# Patient Record
Sex: Male | Born: 1975 | Race: White | Hispanic: No | Marital: Married | Smoking: Never smoker
Health system: Southern US, Community
[De-identification: ages and names within clinical notes are randomized; demographics above are authoritative.]

## PROBLEM LIST (undated history)

## (undated) DIAGNOSIS — Z9889 Other specified postprocedural states: Secondary | ICD-10-CM

## (undated) DIAGNOSIS — R112 Nausea with vomiting, unspecified: Secondary | ICD-10-CM

## (undated) DIAGNOSIS — D171 Benign lipomatous neoplasm of skin and subcutaneous tissue of trunk: Secondary | ICD-10-CM

## (undated) DIAGNOSIS — J302 Other seasonal allergic rhinitis: Secondary | ICD-10-CM

## (undated) DIAGNOSIS — K219 Gastro-esophageal reflux disease without esophagitis: Secondary | ICD-10-CM

## (undated) HISTORY — PX: LAPAROSCOPIC APPENDECTOMY: SUR753

## (undated) HISTORY — PX: WISDOM TOOTH EXTRACTION: SHX21

---

## 1999-11-23 ENCOUNTER — Encounter: Admission: RE | Admit: 1999-11-23 | Discharge: 1999-12-19 | Payer: Self-pay | Admitting: Family Medicine

## 2005-09-24 HISTORY — PX: LUMBAR FUSION: SHX111

## 2006-07-12 ENCOUNTER — Inpatient Hospital Stay (HOSPITAL_COMMUNITY): Admission: RE | Admit: 2006-07-12 | Discharge: 2006-07-16 | Payer: Self-pay | Admitting: Neurosurgery

## 2015-03-25 DIAGNOSIS — D171 Benign lipomatous neoplasm of skin and subcutaneous tissue of trunk: Secondary | ICD-10-CM

## 2015-03-25 HISTORY — DX: Benign lipomatous neoplasm of skin and subcutaneous tissue of trunk: D17.1

## 2015-04-05 NOTE — H&P (Addendum)
  Subjective:    Patient ID: Dan Baker is a 39 y.o. male.  HPI  Referred by Dr. Elvera Lennox for treatment mass left flank. Notes he had lumbar fusion 2007 and shortly thereafter developed mass over left flank. Has steadily grown in size. Evaluated by dermatologist 5 years ago and was told cosmetic. Since that time has continued to grow and now painful with movement of back, tender with continuous pressure over area. Also had two acute episodes of muscle spasm in area requiring muscle relaxant.  Review of Systems 12 point review negative  PMH: none PSH: lumbar fusion SH: non smoker    Objective:   Physical Exam  Cardiovascular: Normal rate, regular rhythm and normal heart sounds.  Pulmonary/Chest: Effort normal and breath sounds normal.  Musculoskeletal:  Left back/flank with 10 x 15 cm nonmobile firm mass, no punctum, no cellulitis  Skin:  Dan Baker 2       Assessment:     Lipoma     Plan:     Large lipoma back that is continuing to grow and now associated pain, tenderness, muscle spasms. Rec excision. Given size and location, plan in OR under GA. Reviewed OP procedure, possible drain, post procedure limitations, scar. Reviewed importance sun protection scar, risk recurrence, seroma.     Dan Limbo, MD [facility] [phone]

## 2015-04-08 ENCOUNTER — Encounter (HOSPITAL_BASED_OUTPATIENT_CLINIC_OR_DEPARTMENT_OTHER): Payer: Self-pay | Admitting: *Deleted

## 2015-04-15 ENCOUNTER — Ambulatory Visit (HOSPITAL_BASED_OUTPATIENT_CLINIC_OR_DEPARTMENT_OTHER): Payer: 59 | Admitting: Anesthesiology

## 2015-04-15 ENCOUNTER — Encounter (HOSPITAL_BASED_OUTPATIENT_CLINIC_OR_DEPARTMENT_OTHER)
Admission: RE | Disposition: A | Discharge status: Home / Self Care | Payer: Self-pay | Source: Ambulatory Visit | Attending: Plastic Surgery

## 2015-04-15 ENCOUNTER — Encounter (HOSPITAL_BASED_OUTPATIENT_CLINIC_OR_DEPARTMENT_OTHER): Payer: Self-pay | Admitting: Anesthesiology

## 2015-04-15 ENCOUNTER — Ambulatory Visit (HOSPITAL_BASED_OUTPATIENT_CLINIC_OR_DEPARTMENT_OTHER)
Admission: RE | Admit: 2015-04-15 | Discharge: 2015-04-15 | Disposition: A | Discharge status: Home / Self Care | Payer: 59 | Source: Ambulatory Visit | Attending: Plastic Surgery | Admitting: Plastic Surgery

## 2015-04-15 DIAGNOSIS — D171 Benign lipomatous neoplasm of skin and subcutaneous tissue of trunk: Secondary | ICD-10-CM | POA: Diagnosis present

## 2015-04-15 DIAGNOSIS — K219 Gastro-esophageal reflux disease without esophagitis: Secondary | ICD-10-CM | POA: Insufficient documentation

## 2015-04-15 DIAGNOSIS — Z981 Arthrodesis status: Secondary | ICD-10-CM | POA: Diagnosis not present

## 2015-04-15 HISTORY — DX: Gastro-esophageal reflux disease without esophagitis: K21.9

## 2015-04-15 HISTORY — DX: Other specified postprocedural states: Z98.890

## 2015-04-15 HISTORY — DX: Other seasonal allergic rhinitis: J30.2

## 2015-04-15 HISTORY — PX: MASS EXCISION: SHX2000

## 2015-04-15 HISTORY — DX: Benign lipomatous neoplasm of skin and subcutaneous tissue of trunk: D17.1

## 2015-04-15 HISTORY — DX: Nausea with vomiting, unspecified: R11.2

## 2015-04-15 LAB — POCT HEMOGLOBIN-HEMACUE: Hemoglobin: 16.2 g/dL (ref 13.0–17.0)

## 2015-04-15 SURGERY — EXCISION MASS
Anesthesia: General | Laterality: Left

## 2015-04-15 MED ORDER — OXYCODONE-ACETAMINOPHEN 5-325 MG PO TABS: 1.0000 | ORAL_TABLET | ORAL | Status: DC | PRN

## 2015-04-15 MED ORDER — LACTATED RINGERS IV SOLN
INTRAVENOUS | Status: DC
  Administered 2015-04-15: 10:00:00 via INTRAVENOUS
  Administered 2015-04-15: 10 mL/h via INTRAVENOUS

## 2015-04-15 MED ORDER — DEXAMETHASONE SODIUM PHOSPHATE 4 MG/ML IJ SOLN
INTRAMUSCULAR | Status: DC | PRN
  Administered 2015-04-15: 10 mg via INTRAVENOUS

## 2015-04-15 MED ORDER — MIDAZOLAM HCL 2 MG/2ML IJ SOLN
INTRAMUSCULAR | Status: AC
  Filled 2015-04-15: qty 2

## 2015-04-15 MED ORDER — GLYCOPYRROLATE 0.2 MG/ML IJ SOLN: 0.2000 mg | Freq: Once | INTRAMUSCULAR | Status: DC | PRN

## 2015-04-15 MED ORDER — REGADENOSON 0.4 MG/5ML IV SOLN
INTRAVENOUS | Status: AC
  Filled 2015-04-15: qty 5

## 2015-04-15 MED ORDER — MIDAZOLAM HCL 2 MG/2ML IJ SOLN
1.0000 mg | INTRAMUSCULAR | Status: DC | PRN
  Administered 2015-04-15: 2 mg via INTRAVENOUS

## 2015-04-15 MED ORDER — SCOPOLAMINE 1 MG/3DAYS TD PT72: 1.0000 | MEDICATED_PATCH | Freq: Once | TRANSDERMAL | Status: DC | PRN

## 2015-04-15 MED ORDER — ONDANSETRON HCL 4 MG/2ML IJ SOLN
INTRAMUSCULAR | Status: DC | PRN
  Administered 2015-04-15: 4 mg via INTRAVENOUS

## 2015-04-15 MED ORDER — LIDOCAINE HCL (CARDIAC) 10 MG/ML IV SOLN
INTRAVENOUS | Status: DC | PRN
  Administered 2015-04-15: 60 mg via INTRAVENOUS

## 2015-04-15 MED ORDER — FENTANYL CITRATE (PF) 100 MCG/2ML IJ SOLN
50.0000 ug | INTRAMUSCULAR | Status: DC | PRN
  Administered 2015-04-15: 100 ug via INTRAVENOUS
  Administered 2015-04-15: 50 ug via INTRAVENOUS

## 2015-04-15 MED ORDER — OXYCODONE HCL 5 MG PO TABS: 5.0000 mg | ORAL_TABLET | Freq: Once | ORAL | Status: DC | PRN

## 2015-04-15 MED ORDER — BACITRACIN ZINC 500 UNIT/GM EX OINT
TOPICAL_OINTMENT | CUTANEOUS | Status: AC
  Filled 2015-04-15: qty 0.9

## 2015-04-15 MED ORDER — FENTANYL CITRATE (PF) 100 MCG/2ML IJ SOLN
25.0000 ug | INTRAMUSCULAR | Status: DC | PRN
  Administered 2015-04-15: 50 ug via INTRAVENOUS

## 2015-04-15 MED ORDER — PROPOFOL 10 MG/ML IV BOLUS
INTRAVENOUS | Status: DC | PRN
  Administered 2015-04-15: 200 mg via INTRAVENOUS
  Administered 2015-04-15: 50 mg via INTRAVENOUS

## 2015-04-15 MED ORDER — ONDANSETRON HCL 4 MG/2ML IJ SOLN: 4.0000 mg | Freq: Four times a day (QID) | INTRAMUSCULAR | Status: DC | PRN

## 2015-04-15 MED ORDER — BUPIVACAINE-EPINEPHRINE 0.25% -1:200000 IJ SOLN
INTRAMUSCULAR | Status: DC | PRN
  Administered 2015-04-15: 20 mL

## 2015-04-15 MED ORDER — FENTANYL CITRATE (PF) 100 MCG/2ML IJ SOLN
INTRAMUSCULAR | Status: AC
  Filled 2015-04-15: qty 2

## 2015-04-15 MED ORDER — FENTANYL CITRATE (PF) 100 MCG/2ML IJ SOLN
INTRAMUSCULAR | Status: AC
  Filled 2015-04-15: qty 6

## 2015-04-15 MED ORDER — OXYCODONE HCL 5 MG/5ML PO SOLN: 5.0000 mg | Freq: Once | ORAL | Status: DC | PRN

## 2015-04-15 MED ORDER — CHLORHEXIDINE GLUCONATE 4 % EX LIQD: 1.0000 "application " | Freq: Once | CUTANEOUS | Status: DC

## 2015-04-15 MED ORDER — CEFAZOLIN SODIUM-DEXTROSE 2-3 GM-% IV SOLR
2.0000 g | INTRAVENOUS | Status: AC
  Administered 2015-04-15: 2 g via INTRAVENOUS

## 2015-04-15 MED ORDER — SUCCINYLCHOLINE CHLORIDE 20 MG/ML IJ SOLN
INTRAMUSCULAR | Status: DC | PRN
  Administered 2015-04-15: 100 mg via INTRAVENOUS

## 2015-04-15 MED ORDER — LIDOCAINE-EPINEPHRINE 1 %-1:100000 IJ SOLN
INTRAMUSCULAR | Status: AC
  Filled 2015-04-15: qty 1

## 2015-04-15 SURGICAL SUPPLY — 63 items
APL SKNCLS STERI-STRIP NONHPOA (GAUZE/BANDAGES/DRESSINGS)
BENZOIN TINCTURE PRP APPL 2/3 (GAUZE/BANDAGES/DRESSINGS) IMPLANT
BLADE CLIPPER SURG (BLADE) IMPLANT
BLADE SURG 11 STRL SS (BLADE) IMPLANT
BLADE SURG 15 STRL LF DISP TIS (BLADE) ×1 IMPLANT
BLADE SURG 15 STRL SS (BLADE) ×3
CANISTER SUCT 1200ML W/VALVE (MISCELLANEOUS) ×2 IMPLANT
CHLORAPREP W/TINT 26ML (MISCELLANEOUS) ×3 IMPLANT
CLOSURE WOUND 1/2 X4 (GAUZE/BANDAGES/DRESSINGS)
COVER BACK TABLE 60X90IN (DRAPES) ×3 IMPLANT
COVER MAYO STAND STRL (DRAPES) ×3 IMPLANT
DRAIN JP 10F RND SILICONE (MISCELLANEOUS) ×3 IMPLANT
DRAPE LAPAROTOMY 100X72 PEDS (DRAPES) ×2 IMPLANT
DRAPE U-SHAPE 76X120 STRL (DRAPES) IMPLANT
DRSG PAD ABDOMINAL 8X10 ST (GAUZE/BANDAGES/DRESSINGS) ×2 IMPLANT
DRSG TELFA 3X8 NADH (GAUZE/BANDAGES/DRESSINGS) IMPLANT
ELECT COATED BLADE 2.86 ST (ELECTRODE) ×2 IMPLANT
ELECT NEEDLE BLADE 2-5/6 (NEEDLE) IMPLANT
ELECT REM PT RETURN 9FT ADLT (ELECTROSURGICAL) ×3
ELECT REM PT RETURN 9FT PED (ELECTROSURGICAL)
ELECTRODE REM PT RETRN 9FT PED (ELECTROSURGICAL) IMPLANT
ELECTRODE REM PT RTRN 9FT ADLT (ELECTROSURGICAL) ×1 IMPLANT
EVACUATOR SILICONE 100CC (DRAIN) ×2 IMPLANT
GAUZE XEROFORM 1X8 LF (GAUZE/BANDAGES/DRESSINGS) IMPLANT
GLOVE BIO SURGEON STRL SZ 6 (GLOVE) ×3 IMPLANT
GLOVE SURG SS PI 7.0 STRL IVOR (GLOVE) ×4 IMPLANT
GOWN STRL REUS W/ TWL LRG LVL3 (GOWN DISPOSABLE) ×2 IMPLANT
GOWN STRL REUS W/TWL LRG LVL3 (GOWN DISPOSABLE) ×6
LIQUID BAND (GAUZE/BANDAGES/DRESSINGS) ×3 IMPLANT
NDL PRECISIONGLIDE 27X1.5 (NEEDLE) ×1 IMPLANT
NEEDLE HYPO 30GX1 BEV (NEEDLE) IMPLANT
NEEDLE PRECISIONGLIDE 27X1.5 (NEEDLE) ×3 IMPLANT
NS IRRIG 1000ML POUR BTL (IV SOLUTION) ×2 IMPLANT
PACK BASIN DAY SURGERY FS (CUSTOM PROCEDURE TRAY) ×3 IMPLANT
PENCIL BUTTON HOLSTER BLD 10FT (ELECTRODE) ×3 IMPLANT
PIN SAFETY STERILE (MISCELLANEOUS) ×2 IMPLANT
RUBBERBAND STERILE (MISCELLANEOUS) IMPLANT
SHEET MEDIUM DRAPE 40X70 STRL (DRAPES) IMPLANT
SLEEVE SCD COMPRESS KNEE MED (MISCELLANEOUS) ×2 IMPLANT
SPONGE GAUZE 2X2 8PLY STER LF (GAUZE/BANDAGES/DRESSINGS)
SPONGE GAUZE 2X2 8PLY STRL LF (GAUZE/BANDAGES/DRESSINGS) IMPLANT
SPONGE GAUZE 4X4 12PLY STER LF (GAUZE/BANDAGES/DRESSINGS) IMPLANT
SPONGE LAP 18X18 X RAY DECT (DISPOSABLE) ×2 IMPLANT
STRIP CLOSURE SKIN 1/2X4 (GAUZE/BANDAGES/DRESSINGS) IMPLANT
SUCTION FRAZIER TIP 10 FR DISP (SUCTIONS) IMPLANT
SUT ETHILON 4 0 PS 2 18 (SUTURE) ×2 IMPLANT
SUT MNCRL AB 4-0 PS2 18 (SUTURE) ×3 IMPLANT
SUT MON AB 5-0 P3 18 (SUTURE) IMPLANT
SUT PLAIN 5 0 P 3 18 (SUTURE) IMPLANT
SUT PROLENE 5 0 P 3 (SUTURE) IMPLANT
SUT PROLENE 6 0 P 1 18 (SUTURE) IMPLANT
SUT VIC AB 3-0 PS1 18 (SUTURE) ×3
SUT VIC AB 3-0 PS1 18XBRD (SUTURE) ×1 IMPLANT
SUT VICRYL 4-0 PS2 18IN ABS (SUTURE) ×2 IMPLANT
SWAB COLLECTION DEVICE MRSA (MISCELLANEOUS) IMPLANT
SYR BULB 3OZ (MISCELLANEOUS) ×2 IMPLANT
SYR CONTROL 10ML LL (SYRINGE) ×3 IMPLANT
TOWEL OR 17X24 6PK STRL BLUE (TOWEL DISPOSABLE) ×3 IMPLANT
TRAY DSU PREP LF (CUSTOM PROCEDURE TRAY) IMPLANT
TUBE ANAEROBIC SPECIMEN COL (MISCELLANEOUS) IMPLANT
TUBE CONNECTING 20'X1/4 (TUBING) ×1
TUBE CONNECTING 20X1/4 (TUBING) ×1 IMPLANT
YANKAUER SUCT BULB TIP 10FT TU (MISCELLANEOUS) ×2 IMPLANT

## 2015-04-15 NOTE — Anesthesia Procedure Notes (Signed)
Procedure Name: Intubation Date/Time: 04/15/2015 9:27 AM Performed by: Lyndee Leo Pre-anesthesia Checklist: Patient identified, Emergency Drugs available, Suction available and Patient being monitored Patient Re-evaluated:Patient Re-evaluated prior to inductionOxygen Delivery Method: Circle System Utilized Preoxygenation: Pre-oxygenation with 100% oxygen Intubation Type: IV induction Ventilation: Mask ventilation without difficulty Laryngoscope Size: Miller and 3 Grade View: Grade II Tube type: Oral Tube size: 8.0 mm Number of attempts: 1 Airway Equipment and Method: Stylet and Oral airway Placement Confirmation: ETT inserted through vocal cords under direct vision,  positive ETCO2 and breath sounds checked- equal and bilateral Secured at: 22 cm Tube secured with: Tape Dental Injury: Teeth and Oropharynx as per pre-operative assessment

## 2015-04-15 NOTE — Op Note (Signed)
Operative Note   DATE OF OPERATION: 7.22.16  LOCATION: [facility]-outpatient  SURGICAL DIVISION: Plastic Surgery  PREOPERATIVE DIAGNOSES:  1. Soft tissue mass back 2. Lipoma  POSTOPERATIVE DIAGNOSES:  same  PROCEDURE:  1. Excision subfascial mass left back 10 cm  SURGEON: [name] MD MBA  ASSISTANT: none  ANESTHESIA:  General.   EBL: minimal  COMPLICATIONS: None immediate.   INDICATIONS FOR PROCEDURE:  The patient, Dan Baker, is a 39 y.o. male born on 04/17/1976, is here for excision mass left flank.   FINDINGS: Clinically lipoma located beneath Scarpa's fascia and adherent to latissimus muscle.   DESCRIPTION OF PROCEDURE:  The patient's operative site was marked with the patient in the preoperative area. The patient was taken to the operating room. SCDs were placed and IV antibiotics were given. Following induction, patient was placed in prone position.The patient's operative site was prepped and draped in a sterile fashion. A time out was performed and all information was confirmed to be correct. Local anesthetic infiltrated surrounding mass. Incision made in natural skin tension line over mass. Incision carried through subcutaneous tissue and Scarpa's fascia incised. Yellow non encapsulated mass consistent with lipoma encountered. Skin and subcutaneous tissue elevated off anterior surface mass and then mass elevated off latissimus dorsi muscle. Cavity irrigated and hemostasis obtained. Mass dimensions 10 x 15 cm. Additional local anesthetic infiltrated into underlying muscle. 10 Fr round drain placed and secured to skin with 3-0 nylon. Plication suture placed between Scarpa's fascia and latissimus muscle. Closure incision completed with 3-0 vicryl in fascial layered followed by 4-0 vicryl in dermis. Skin closed with 4-0 monocryl subcuticular and tissue adhesive applied. Dry dressing applied and patient returned to supine position.   The patient was allowed to wake  from anesthesia, extubated and taken to the recovery room in satisfactory condition.   SPECIMENS: soft tissue mass left back  DRAINS: 10 Fr JP in left back  [name], MD [facility] [phone]

## 2015-04-15 NOTE — Discharge Instructions (Signed)

## 2015-04-15 NOTE — Interval H&P Note (Signed)
History and Physical Interval Note:  04/15/2015 9:16 AM  Dan Baker  has presented today for surgery, with the diagnosis of LIPOMA FLANK  The various methods of treatment have been discussed with the patient and family. After consideration of risks, benefits and other options for treatment, the patient has consented to  Procedure(s): EXCISION OF SUBCUTANEOUS MASS,LEFT BACK 10CM (N/A) as a surgical intervention .  The patient's history has been reviewed, patient examined, no change in status, stable for surgery.  I have reviewed the patient's chart and labs.  Questions were answered to the patient's satisfaction.     Dan Baker

## 2015-04-15 NOTE — Anesthesia Postprocedure Evaluation (Signed)
Anesthesia Post Note  Patient: Dan Baker  Procedure(s) Performed: Procedure(s) (LRB): EXCISION OF SUBCUTANEOUS MASS,LEFT BACK 10CM (Left)  Anesthesia type: General  Patient location: PACU  Post pain: Pain level controlled and Adequate analgesia  Post assessment: Post-op Vital signs reviewed, Patient's Cardiovascular Status Stable, Respiratory Function Stable, Patent Airway and Pain level controlled  Last Vitals:  Filed Vitals:   04/15/15 1129  BP: 108/64  Pulse: 53  Temp: 36.5 C  Resp: 16    Post vital signs: Reviewed and stable  Level of consciousness: awake, alert  and oriented  Complications: No apparent anesthesia complications

## 2015-04-15 NOTE — Transfer of Care (Signed)
Immediate Anesthesia Transfer of Care Note  Patient: Dan Baker  Procedure(s) Performed: Procedure(s): EXCISION OF SUBCUTANEOUS MASS,LEFT BACK 10CM (Left)  Patient Location: PACU  Anesthesia Type:General  Level of Consciousness: awake, sedated and patient cooperative  Airway & Oxygen Therapy: Patient Spontanous Breathing and Patient connected to face mask oxygen  Post-op Assessment: Report given to RN and Post -op Vital signs reviewed and stable  Post vital signs: Reviewed and stable  Last Vitals:  Filed Vitals:   04/15/15 0839  BP: 117/74  Pulse: 64  Temp: 36.6 C  Resp: 20    Complications: No apparent anesthesia complications

## 2015-04-15 NOTE — Anesthesia Preprocedure Evaluation (Signed)
Anesthesia Evaluation  Patient identified by MRN, date of birth, ID band Patient awake    Reviewed: Allergy & Precautions, NPO status , Patient's Chart, lab work & pertinent test results  History of Anesthesia Complications (+) PONV and history of anesthetic complications  Airway Mallampati: II   Neck ROM: full    Dental   Pulmonary neg pulmonary ROS,  breath sounds clear to auscultation        Cardiovascular negative cardio ROS  Rhythm:regular Rate:Normal     Neuro/Psych    GI/Hepatic GERD-  ,  Endo/Other    Renal/GU      Musculoskeletal   Abdominal   Peds  Hematology   Anesthesia Other Findings   Reproductive/Obstetrics                             Anesthesia Physical Anesthesia Plan  ASA: II  Anesthesia Plan: General   Post-op Pain Management:    Induction: Intravenous  Airway Management Planned: Oral ETT  Additional Equipment:   Intra-op Plan:   Post-operative Plan: Extubation in OR  Informed Consent: I have reviewed the patients History and Physical, chart, labs and discussed the procedure including the risks, benefits and alternatives for the proposed anesthesia with the patient or authorized representative who has indicated his/her understanding and acceptance.     Plan Discussed with: CRNA, Anesthesiologist and Surgeon  Anesthesia Plan Comments:         Anesthesia Quick Evaluation

## 2015-04-18 ENCOUNTER — Encounter (HOSPITAL_BASED_OUTPATIENT_CLINIC_OR_DEPARTMENT_OTHER): Payer: Self-pay | Admitting: Plastic Surgery

## 2019-11-29 ENCOUNTER — Ambulatory Visit: Payer: 59 | Attending: Internal Medicine

## 2019-11-29 DIAGNOSIS — Z23 Encounter for immunization: Secondary | ICD-10-CM | POA: Insufficient documentation

## 2019-11-29 NOTE — Progress Notes (Signed)
   Covid-19 Vaccination Clinic  Name:  Dan Baker    MRN: [id number] DOB: [birth year]  11/29/2019  [name] was observed post Covid-19 immunization for 15 minutes without incident. He was provided with Vaccine Information Sheet and instruction to access the V-Safe system.   [name] was instructed to call 911 with any severe reactions post vaccine: Marland Kitchen Difficulty breathing  . Swelling of face and throat  . A fast heartbeat  . A bad rash all over body  . Dizziness and weakness   Immunizations Administered    Name Date Dose VIS Date Route   Pfizer COVID-19 Vaccine 11/29/2019  6:21 PM 0.3 mL 09/04/2019 Intramuscular   Manufacturer: [place]   Lot: [id number]   [place]: [id number]

## 2019-12-30 ENCOUNTER — Ambulatory Visit: Payer: 59 | Attending: Internal Medicine

## 2019-12-30 DIAGNOSIS — Z23 Encounter for immunization: Secondary | ICD-10-CM

## 2019-12-30 NOTE — Progress Notes (Signed)
   Covid-19 Vaccination Clinic  Name:  Dan Baker    MRN: [id number] DOB: [birth year]  12/30/2019  Dan Baker was observed post Covid-19 immunization for 15 minutes without incident. He was provided with Vaccine Information Sheet and instruction to access the V-Safe system.   Dan Baker was instructed to call 911 with any severe reactions post vaccine: Marland Kitchen Difficulty breathing  . Swelling of face and throat  . A fast heartbeat  . A bad rash all over body  . Dizziness and weakness   Immunizations Administered    Name Date Dose VIS Date Route   Pfizer COVID-19 Vaccine 12/30/2019 12:49 PM 0.3 mL 09/04/2019 Intramuscular   Manufacturer: [place]   Lot: [id number]   [place]: [id number]

## 2020-08-06 ENCOUNTER — Ambulatory Visit: Payer: Self-pay

## 2020-08-06 ENCOUNTER — Ambulatory Visit: Payer: Self-pay | Attending: Internal Medicine

## 2020-08-06 DIAGNOSIS — Z23 Encounter for immunization: Secondary | ICD-10-CM

## 2020-08-06 NOTE — Progress Notes (Signed)
   Covid-19 Vaccination Clinic  Name:  Dan Baker    MRN: [id number] DOB: [birth year]  08/06/2020  [name] was observed post Covid-19 immunization for 15 minutes without incident. He was provided with Vaccine Information Sheet and instruction to access the V-Safe system.   [name] was instructed to call 911 with any severe reactions post vaccine: Marland Kitchen Difficulty breathing  . Swelling of face and throat  . A fast heartbeat  . A bad rash all over body  . Dizziness and weakness   Immunizations Administered    Name Date Dose VIS Date Route   Pfizer COVID-19 Vaccine 08/06/2020  4:49 PM 0.3 mL 07/13/2020 Intramuscular   Manufacturer: [place]   Lot: [id number]   [place]: [id number]

## 2021-02-08 ENCOUNTER — Emergency Department (HOSPITAL_BASED_OUTPATIENT_CLINIC_OR_DEPARTMENT_OTHER): Payer: BC Managed Care – PPO

## 2021-02-08 ENCOUNTER — Other Ambulatory Visit: Payer: Self-pay

## 2021-02-08 ENCOUNTER — Encounter (HOSPITAL_COMMUNITY)
Admission: EM | Disposition: A | Discharge status: Home / Self Care | Payer: Self-pay | Source: Home / Self Care | Attending: Emergency Medicine

## 2021-02-08 ENCOUNTER — Encounter (HOSPITAL_BASED_OUTPATIENT_CLINIC_OR_DEPARTMENT_OTHER): Payer: Self-pay

## 2021-02-08 ENCOUNTER — Ambulatory Visit (HOSPITAL_BASED_OUTPATIENT_CLINIC_OR_DEPARTMENT_OTHER)
Admission: EM | Admit: 2021-02-08 | Discharge: 2021-02-09 | Disposition: A | Discharge status: Home / Self Care | Payer: BC Managed Care – PPO | Attending: Emergency Medicine | Admitting: Emergency Medicine

## 2021-02-08 ENCOUNTER — Emergency Department (HOSPITAL_COMMUNITY): Payer: BC Managed Care – PPO | Admitting: Anesthesiology

## 2021-02-08 DIAGNOSIS — Z20822 Contact with and (suspected) exposure to covid-19: Secondary | ICD-10-CM | POA: Insufficient documentation

## 2021-02-08 DIAGNOSIS — Z79899 Other long term (current) drug therapy: Secondary | ICD-10-CM | POA: Diagnosis not present

## 2021-02-08 DIAGNOSIS — K3580 Unspecified acute appendicitis: Secondary | ICD-10-CM | POA: Insufficient documentation

## 2021-02-08 DIAGNOSIS — R1031 Right lower quadrant pain: Secondary | ICD-10-CM | POA: Diagnosis present

## 2021-02-08 DIAGNOSIS — Z880 Allergy status to penicillin: Secondary | ICD-10-CM | POA: Diagnosis not present

## 2021-02-08 DIAGNOSIS — K353 Acute appendicitis with localized peritonitis, without perforation or gangrene: Secondary | ICD-10-CM

## 2021-02-08 HISTORY — PX: LAPAROSCOPIC APPENDECTOMY: SHX408

## 2021-02-08 LAB — URINALYSIS, ROUTINE W REFLEX MICROSCOPIC
Bilirubin Urine: NEGATIVE
Glucose, UA: NEGATIVE mg/dL
Hgb urine dipstick: NEGATIVE
Ketones, ur: NEGATIVE mg/dL
Leukocytes,Ua: NEGATIVE
Nitrite: NEGATIVE
Protein, ur: NEGATIVE mg/dL
Specific Gravity, Urine: 1.016 (ref 1.005–1.030)
pH: 6.5 (ref 5.0–8.0)

## 2021-02-08 LAB — CBC WITH DIFFERENTIAL/PLATELET
Abs Immature Granulocytes: 0.06 10*3/uL (ref 0.00–0.07)
Basophils Absolute: 0 10*3/uL (ref 0.0–0.1)
Basophils Relative: 0 %
Eosinophils Absolute: 0.4 10*3/uL (ref 0.0–0.5)
Eosinophils Relative: 3 %
HCT: 45.3 % (ref 39.0–52.0)
Hemoglobin: 15.6 g/dL (ref 13.0–17.0)
Immature Granulocytes: 0 %
Lymphocytes Relative: 19 %
Lymphs Abs: 2.5 10*3/uL (ref 0.7–4.0)
MCH: 30.3 pg (ref 26.0–34.0)
MCHC: 34.4 g/dL (ref 30.0–36.0)
MCV: 88 fL (ref 80.0–100.0)
Monocytes Absolute: 1.1 10*3/uL — ABNORMAL HIGH (ref 0.1–1.0)
Monocytes Relative: 8 %
Neutro Abs: 9.5 10*3/uL — ABNORMAL HIGH (ref 1.7–7.7)
Neutrophils Relative %: 70 %
Platelets: 225 10*3/uL (ref 150–400)
RBC: 5.15 MIL/uL (ref 4.22–5.81)
RDW: 12.2 % (ref 11.5–15.5)
WBC: 13.6 10*3/uL — ABNORMAL HIGH (ref 4.0–10.5)
nRBC: 0 % (ref 0.0–0.2)

## 2021-02-08 LAB — COMPREHENSIVE METABOLIC PANEL
ALT: 14 U/L (ref 0–44)
AST: 13 U/L — ABNORMAL LOW (ref 15–41)
Albumin: 4.8 g/dL (ref 3.5–5.0)
Alkaline Phosphatase: 68 U/L (ref 38–126)
Anion gap: 10 (ref 5–15)
BUN: 12 mg/dL (ref 6–20)
CO2: 29 mmol/L (ref 22–32)
Calcium: 9.9 mg/dL (ref 8.9–10.3)
Chloride: 100 mmol/L (ref 98–111)
Creatinine, Ser: 1.09 mg/dL (ref 0.61–1.24)
GFR, Estimated: >60 mL/min (ref >60)
Glucose, Bld: 95 mg/dL (ref 70–99)
Potassium: 3.7 mmol/L (ref 3.5–5.1)
Sodium: 139 mmol/L (ref 135–145)
Total Bilirubin: 0.6 mg/dL (ref 0.3–1.2)
Total Protein: 7.5 g/dL (ref 6.5–8.1)

## 2021-02-08 LAB — RESP PANEL BY RT-PCR (FLU A&B, COVID) ARPGX2
Influenza A by PCR: NEGATIVE
Influenza B by PCR: NEGATIVE
SARS Coronavirus 2 by RT PCR: NEGATIVE

## 2021-02-08 LAB — LIPASE, BLOOD: Lipase: 19 U/L (ref 11–51)

## 2021-02-08 SURGERY — APPENDECTOMY, LAPAROSCOPIC
Anesthesia: General | Site: Abdomen

## 2021-02-08 MED ORDER — ONDANSETRON HCL 4 MG/2ML IJ SOLN
INTRAMUSCULAR | Status: DC | PRN
  Administered 2021-02-08: 4 mg via INTRAVENOUS

## 2021-02-08 MED ORDER — OXYCODONE HCL 5 MG PO TABS
5.0000 mg | ORAL_TABLET | Freq: Once | ORAL | Status: AC | PRN
  Administered 2021-02-09: 5 mg via ORAL

## 2021-02-08 MED ORDER — FENTANYL CITRATE (PF) 250 MCG/5ML IJ SOLN
INTRAMUSCULAR | Status: DC | PRN
  Administered 2021-02-08 (×3): 50 ug via INTRAVENOUS
  Administered 2021-02-08: 100 ug via INTRAVENOUS

## 2021-02-08 MED ORDER — MIDAZOLAM HCL 5 MG/5ML IJ SOLN
INTRAMUSCULAR | Status: DC | PRN
  Administered 2021-02-08: 2 mg via INTRAVENOUS

## 2021-02-08 MED ORDER — LIDOCAINE HCL (CARDIAC) PF 100 MG/5ML IV SOSY
PREFILLED_SYRINGE | INTRAVENOUS | Status: DC | PRN
  Administered 2021-02-08: 60 mg via INTRATRACHEAL

## 2021-02-08 MED ORDER — ROCURONIUM BROMIDE 100 MG/10ML IV SOLN
INTRAVENOUS | Status: DC | PRN
  Administered 2021-02-08: 50 mg via INTRAVENOUS

## 2021-02-08 MED ORDER — METRONIDAZOLE 500 MG/100ML IV SOLN
500.0000 mg | Freq: Once | INTRAVENOUS | Status: AC
  Administered 2021-02-08: 500 mg via INTRAVENOUS
  Filled 2021-02-08: qty 100

## 2021-02-08 MED ORDER — MORPHINE SULFATE (PF) 4 MG/ML IV SOLN
4.0000 mg | Freq: Once | INTRAVENOUS | Status: AC
  Administered 2021-02-08: 4 mg via INTRAVENOUS
  Filled 2021-02-08: qty 1

## 2021-02-08 MED ORDER — PROMETHAZINE HCL 25 MG/ML IJ SOLN: 6.2500 mg | INTRAMUSCULAR | Status: DC | PRN

## 2021-02-08 MED ORDER — SODIUM CHLORIDE 0.9 % IV BOLUS
1000.0000 mL | Freq: Once | INTRAVENOUS | Status: AC
  Administered 2021-02-08: 1000 mL via INTRAVENOUS

## 2021-02-08 MED ORDER — 0.9 % SODIUM CHLORIDE (POUR BTL) OPTIME
TOPICAL | Status: DC | PRN
  Administered 2021-02-08: 1000 mL

## 2021-02-08 MED ORDER — SODIUM CHLORIDE 0.9 % IR SOLN
Status: DC | PRN
  Administered 2021-02-08: 1000 mL

## 2021-02-08 MED ORDER — DEXAMETHASONE SODIUM PHOSPHATE 10 MG/ML IJ SOLN
INTRAMUSCULAR | Status: DC | PRN
  Administered 2021-02-08: 10 mg via INTRAVENOUS

## 2021-02-08 MED ORDER — ACETAMINOPHEN 10 MG/ML IV SOLN
INTRAVENOUS | Status: DC | PRN
  Administered 2021-02-08: 1000 mg via INTRAVENOUS

## 2021-02-08 MED ORDER — LACTATED RINGERS IV SOLN: INTRAVENOUS | Status: DC | PRN

## 2021-02-08 MED ORDER — SUCCINYLCHOLINE CHLORIDE 20 MG/ML IJ SOLN
INTRAMUSCULAR | Status: DC | PRN
  Administered 2021-02-08: 120 mg via INTRAVENOUS

## 2021-02-08 MED ORDER — SUGAMMADEX SODIUM 200 MG/2ML IV SOLN
INTRAVENOUS | Status: DC | PRN
  Administered 2021-02-08: 200 mg via INTRAVENOUS

## 2021-02-08 MED ORDER — OXYCODONE HCL 5 MG/5ML PO SOLN: 5.0000 mg | Freq: Once | ORAL | Status: AC | PRN

## 2021-02-08 MED ORDER — PHENYLEPHRINE 40 MCG/ML (10ML) SYRINGE FOR IV PUSH (FOR BLOOD PRESSURE SUPPORT)
PREFILLED_SYRINGE | INTRAVENOUS | Status: DC | PRN
  Administered 2021-02-08: 40 ug via INTRAVENOUS

## 2021-02-08 MED ORDER — SODIUM CHLORIDE 0.9 % IV SOLN
2.0000 g | Freq: Once | INTRAVENOUS | Status: AC
  Administered 2021-02-08: 2 g via INTRAVENOUS
  Filled 2021-02-08: qty 20

## 2021-02-08 MED ORDER — SODIUM CHLORIDE 0.9 % IV SOLN: INTRAVENOUS | Status: DC | PRN

## 2021-02-08 MED ORDER — BUPIVACAINE HCL 0.25 % IJ SOLN
INTRAMUSCULAR | Status: DC | PRN
  Administered 2021-02-08: 6 mL

## 2021-02-08 MED ORDER — TRAMADOL HCL 50 MG PO TABS: 50.0000 mg | ORAL_TABLET | Freq: Four times a day (QID) | ORAL | 0 refills | Status: AC | PRN

## 2021-02-08 MED ORDER — FENTANYL CITRATE (PF) 100 MCG/2ML IJ SOLN
25.0000 ug | INTRAMUSCULAR | Status: DC | PRN
  Administered 2021-02-09: 50 ug via INTRAVENOUS
  Administered 2021-02-09: 25 ug via INTRAVENOUS

## 2021-02-08 MED ORDER — PROPOFOL 10 MG/ML IV BOLUS
INTRAVENOUS | Status: DC | PRN
  Administered 2021-02-08: 200 mg via INTRAVENOUS

## 2021-02-08 SURGICAL SUPPLY — 42 items
ADH SKN CLS APL DERMABOND .7 (GAUZE/BANDAGES/DRESSINGS) ×1
APL PRP STRL LF DISP 70% ISPRP (MISCELLANEOUS) ×1
APPLIER CLIP 5 13 M/L LIGAMAX5 (MISCELLANEOUS)
APR CLP MED LRG 5 ANG JAW (MISCELLANEOUS)
BLADE CLIPPER SURG (BLADE) ×1 IMPLANT
CANISTER SUCT 3000ML PPV (MISCELLANEOUS) ×2 IMPLANT
CHLORAPREP W/TINT 26 (MISCELLANEOUS) ×2 IMPLANT
CLIP APPLIE 5 13 M/L LIGAMAX5 (MISCELLANEOUS) IMPLANT
CLIP VESOLOCK XL 6/CT (CLIP) ×2 IMPLANT
COVER SURGICAL LIGHT HANDLE (MISCELLANEOUS) ×2 IMPLANT
DERMABOND ADVANCED (GAUZE/BANDAGES/DRESSINGS) ×1
DERMABOND ADVANCED .7 DNX12 (GAUZE/BANDAGES/DRESSINGS) ×1 IMPLANT
ELECT REM PT RETURN 9FT ADLT (ELECTROSURGICAL) ×2
ELECTRODE REM PT RTRN 9FT ADLT (ELECTROSURGICAL) ×1 IMPLANT
ENDOLOOP SUT PDS II  0 18 (SUTURE)
ENDOLOOP SUT PDS II 0 18 (SUTURE) IMPLANT
GLOVE BIO SURGEON STRL SZ7.5 (GLOVE) ×2 IMPLANT
GOWN STRL REUS W/ TWL LRG LVL3 (GOWN DISPOSABLE) ×2 IMPLANT
GOWN STRL REUS W/ TWL XL LVL3 (GOWN DISPOSABLE) ×1 IMPLANT
GOWN STRL REUS W/TWL LRG LVL3 (GOWN DISPOSABLE) ×4
GOWN STRL REUS W/TWL XL LVL3 (GOWN DISPOSABLE) ×2
GRASPER SUT TROCAR 14GX15 (MISCELLANEOUS) ×2 IMPLANT
KIT BASIN OR (CUSTOM PROCEDURE TRAY) ×2 IMPLANT
KIT TURNOVER KIT B (KITS) ×2 IMPLANT
NDL INSUFFLATION 14GA 120MM (NEEDLE) ×1 IMPLANT
NEEDLE INSUFFLATION 14GA 120MM (NEEDLE) ×2 IMPLANT
NS IRRIG 1000ML POUR BTL (IV SOLUTION) ×2 IMPLANT
PAD ARMBOARD 7.5X6 YLW CONV (MISCELLANEOUS) ×5 IMPLANT
SCISSORS LAP 5X35 DISP (ENDOMECHANICALS) ×2 IMPLANT
SET IRRIG TUBING LAPAROSCOPIC (IRRIGATION / IRRIGATOR) ×2 IMPLANT
SET TUBE SMOKE EVAC HIGH FLOW (TUBING) ×2 IMPLANT
SLEEVE ENDOPATH XCEL 5M (ENDOMECHANICALS) ×2 IMPLANT
SLEEVE SCD COMPRESS KNEE MED (STOCKING) ×1 IMPLANT
SPECIMEN JAR SMALL (MISCELLANEOUS) ×2 IMPLANT
SUT MNCRL AB 4-0 PS2 18 (SUTURE) ×2 IMPLANT
TOWEL GREEN STERILE (TOWEL DISPOSABLE) ×2 IMPLANT
TOWEL GREEN STERILE FF (TOWEL DISPOSABLE) ×2 IMPLANT
TRAY LAPAROSCOPIC MC (CUSTOM PROCEDURE TRAY) ×2 IMPLANT
TROCAR XCEL NON-BLD 11X100MML (ENDOMECHANICALS) ×2 IMPLANT
TROCAR XCEL NON-BLD 5MMX100MML (ENDOMECHANICALS) ×2 IMPLANT
WARMER LAPAROSCOPE (MISCELLANEOUS) ×2 IMPLANT
WATER STERILE IRR 1000ML POUR (IV SOLUTION) ×2 IMPLANT

## 2021-02-08 NOTE — Discharge Instructions (Signed)
Laparoscopic Appendectomy, Adult, Care After This sheet gives you information about how to care for yourself after your procedure. Your doctor may also give you more specific instructions. If you have problems or questions, contact your doctor. What can I expect after the procedure? After the procedure, it is common to have:  Little energy for normal activities.  Mild pain in the area where the cuts from surgery (incisions) were made.  Trouble pooping (constipation). This can be caused by: ? Pain medicine. ? A lack of activity. Follow these instructions at home: Medicines  Take over-the-counter and prescription medicines only as told by your doctor.  If you were prescribed an antibiotic medicine, take it as told by your doctor. Do not stop taking it even if you start to feel better.  Do not drive or use heavy machinery while taking prescription pain medicine.  Ask your doctor if the medicine you are taking can cause trouble pooping. You may need to take steps to prevent or treat trouble pooping: ? Drink enough fluid to keep your pee (urine) pale yellow. ? Take over-the-counter or prescription medicines. ? Eat foods that are high in fiber. These include beans, whole grains, and fresh fruits and vegetables. ? Limit foods that are high in fat and sugar. These include fried or sweet foods. Incision care  Follow instructions from your doctor about how to take care of your cuts from surgery. Make sure you: ? Wash your hands with soap and water before and after you change your bandage (dressing). If you cannot use soap and water, use hand sanitizer. ? Change your bandage as told by your doctor. ? Leave stitches (sutures), skin glue, or skin tape (adhesive) strips in place. They may need to stay in place for 2 weeks or longer. If tape strips get loose and curl up, you may trim the loose edges. Do not remove tape strips completely unless your doctor says it is okay.  Check your cuts from  surgery every day for signs of infection. Check for: ? Redness, swelling, or pain. ? Fluid or blood. ? Warmth. ? Pus or a bad smell.   Bathing  Keep your cuts from surgery clean and dry. Clean them as told by your doctor. To do this: 1. Gently wash the cuts with soap and water. 2. Rinse the cuts with water to remove all soap. 3. Pat the cuts dry with a clean towel. Do not rub the cuts.  Do not take baths, swim, or use a hot tub for 2 weeks, or until your doctor says it is okay. You may take showers after 48 hours. Activity  Do not drive for 24 hours if you were given a medicine to help you relax (sedative) during your procedure.  Rest after the procedure. Return to your normal activities as told by your doctor. Ask your doctor what activities are safe for you.  For 3 weeks, or for as long as told by your doctor: ? Do not lift anything that is heavier than 10 lb (4.5 kg), or the limit that you are told. ? Do not play contact sports.   General instructions  If you were sent home with a drain, follow instructions from your doctor on how to care for it.  Take deep breaths. This helps to keep your lungs from getting an infection (pneumonia).  Keep all follow-up visits as told by your doctor. This is important. Contact a doctor if:  You have redness, swelling, or pain around a cut from  surgery.  You have fluid or blood coming from a cut.  Your cut feels warm to the touch.  You have pus or a bad smell coming from a cut or a bandage.  The edges of a cut break open after the stitches have been taken out.  You have pain in your shoulders that gets worse.  You feel dizzy or you pass out (faint).  You have shortness of breath.  You keep feeling sick to your stomach (nauseous).  You keep throwing up (vomiting).  You get watery poop (diarrhea) or you cannot control your poop.  You lose your appetite.  You have swelling or pain in your legs.  You get a rash. Get help right  away if:  You have a fever.  You have trouble breathing.  You have sharp pains in your chest. Summary  After the procedure, it is common to have low energy, mild pain, and trouble pooping.  Infection is a common problem after this procedure. Follow your doctor's instructions about caring for yourself after the procedure.  Rest after the procedure. Return to your normal activities as told by your doctor.  Contact your doctor if you see signs of infection around your cuts from surgery, or you get short of breath. Get help right away if you have a fever, chest pain, or trouble breathing. This information is not intended to replace advice given to you by your health care provider. Make sure you discuss any questions you have with your health care provider. Document Revised: 03/13/2018 Document Reviewed: 03/13/2018 Elsevier Patient Education  2021 [employer].

## 2021-02-08 NOTE — Anesthesia Procedure Notes (Signed)
Procedure Name: Intubation Date/Time: 02/08/2021 10:52 PM Performed by: Clovis Cao, CRNA Pre-anesthesia Checklist: Patient identified, Emergency Drugs available, Suction available, Patient being monitored and Timeout performed Patient Re-evaluated:Patient Re-evaluated prior to induction Oxygen Delivery Method: Circle system utilized Preoxygenation: Pre-oxygenation with 100% oxygen Induction Type: IV induction, Rapid sequence and Cricoid Pressure applied Laryngoscope Size: Miller and 2 Grade View: Grade I Tube type: Oral Tube size: 7.5 mm Number of attempts: 1 Airway Equipment and Method: Stylet Placement Confirmation: ETT inserted through vocal cords under direct vision,  positive ETCO2 and breath sounds checked- equal and bilateral Secured at: 23 cm Tube secured with: Tape Dental Injury: Teeth and Oropharynx as per pre-operative assessment

## 2021-02-08 NOTE — ED Provider Notes (Signed)
[facility] EMERGENCY DEPARTMENT Provider Note   CSN: [id number] Arrival date & time: 02/08/21  1622     History Chief Complaint  Patient presents with  . Abdominal Pain    Dan Baker is a 45 y.o. male.  Patient presents with abdominal pain.  He says it started yesterday sharp and aching.  Worse in the right lower quadrant.  Seen at outside facility and diagnosed with acute appendicitis and sent here.        Past Medical History:  Diagnosis Date  . GERD (gastroesophageal reflux disease)    occasional; Zantac as needed  . Lipoma of flank 03/2015   left  . PONV (postoperative nausea and vomiting)   . Seasonal allergies     There are no problems to display for this patient.   Past Surgical History:  Procedure Laterality Date  . LUMBAR FUSION  2007   L4-5  . MASS EXCISION Left 04/15/2015   Procedure: EXCISION OF SUBCUTANEOUS MASS,LEFT BACK 10CM;  Surgeon: [name], MD;  Location: [facility];  Service: Plastics;  Laterality: Left;  . WISDOM TOOTH EXTRACTION         History reviewed. No pertinent family history.  Social History   Tobacco Use  . Smoking status: Never Smoker  . Smokeless tobacco: Never Used  Substance Use Topics  . Alcohol use: Yes    Comment: occasionally  . Drug use: No    Home Medications Prior to Admission medications   Medication Sig Start Date End Date Taking? Authorizing Provider  cetirizine (ZYRTEC) 10 MG tablet Take 10 mg by mouth daily.    [provider]  oxyCODONE-acetaminophen (ROXICET) 5-325 MG per tablet Take 1-2 tablets by mouth every 4 (four) hours as needed for severe pain. 04/15/15   [name], MD  ranitidine (ZANTAC) 150 MG tablet Take 150 mg by mouth 2 (two) times daily.    [provider]    Allergies    Penicillins  Review of Systems   Review of Systems  Constitutional: Negative for fever.  HENT: Negative for ear pain and sore throat.   Eyes:  Negative for pain.  Respiratory: Negative for cough.   Cardiovascular: Negative for chest pain.  Gastrointestinal: Positive for abdominal pain.  Genitourinary: Negative for flank pain.  Musculoskeletal: Negative for back pain.  Skin: Negative for color change and rash.  Neurological: Negative for syncope.  All other systems reviewed and are negative.   Physical Exam Updated Vital Signs BP (!) 141/92   Pulse 70   Temp 98.8 F (37.1 C) (Oral)   Resp 17   Ht 6\' 1"  (1.854 m)   Wt 77.1 kg   SpO2 100%   BMI 22.43 kg/m   Physical Exam Constitutional:      General: He is not in acute distress.    Appearance: He is well-developed.  HENT:     Head: Normocephalic.     Nose: Nose normal.  Eyes:     Extraocular Movements: Extraocular movements intact.  Cardiovascular:     Rate and Rhythm: Normal rate.  Pulmonary:     Effort: Pulmonary effort is normal.  Abdominal:     Tenderness: There is abdominal tenderness.  Skin:    Coloration: Skin is not jaundiced.  Neurological:     Mental Status: He is alert. Mental status is at baseline.     ED Results / Procedures / Treatments   Labs (all labs ordered are listed, but only abnormal results  are displayed) Labs Reviewed  COMPREHENSIVE METABOLIC PANEL - Abnormal; Notable for the following components:      Result Value   AST 13 (*)    All other components within normal limits  CBC WITH DIFFERENTIAL/PLATELET - Abnormal; Notable for the following components:   WBC 13.6 (*)    Neutro Abs 9.5 (*)    Monocytes Absolute 1.1 (*)    All other components within normal limits  RESP PANEL BY RT-PCR (FLU A&B, COVID) ARPGX2  LIPASE, BLOOD  URINALYSIS, ROUTINE W REFLEX MICROSCOPIC    EKG None  Radiology CT ABDOMEN PELVIS WO CONTRAST  Result Date: 02/08/2021 CLINICAL DATA:  Right lower quadrant pain EXAM: CT ABDOMEN AND PELVIS WITHOUT CONTRAST TECHNIQUE: Multidetector CT imaging of the abdomen and pelvis was performed following the  standard protocol without IV contrast. COMPARISON:  None. FINDINGS: Lower chest: Lung bases demonstrate no acute consolidation or effusion. Hepatobiliary: No focal liver abnormality is seen. No gallstones, gallbladder wall thickening, or biliary dilatation. Pancreas: Unremarkable. No pancreatic ductal dilatation or surrounding inflammatory changes. Spleen: Normal in size without focal abnormality. Adrenals/Urinary Tract: Adrenal glands are normal. No hydronephrosis. Multiple punctate intrarenal stones on the right. Multiple small stones in the left kidney measuring up to 3 mm in size. Urinary bladder unremarkable Stomach/Bowel: Stomach within normal limits. No dilated small bowel. Dilated retrocecal appendix with inflammation. Appendix measures up to 16 mm. No well-formed stone, punctate hyperdense debris within the lumen, coronal series 5, image 42. no extraluminal gas collection. Vascular/Lymphatic: Small nonaneurysmal aorta.  No suspicious nodes Reproductive: Prostate is unremarkable. Other: Negative for free air or free fluid Musculoskeletal: Post fusion changes L5-S1. No acute osseous abnormality IMPRESSION: Findings consistent with acute retrocecal appendicitis. Appendix: Location: Right mid to lower quadrant, retrocecal in position Diameter: 16 mm Appendicolith: Negative Mucosal hyper-enhancement: Not assessed no contrast given Extraluminal gas: Negative Periappendiceal collection: Considerable inflammation without focal fluid collection Critical Value/emergent results were called by telephone at the time of interpretation on 02/08/2021 at 6:31 pm to provider Sherwood Gambler , who verbally acknowledged these results. Electronically Signed   By: Donavan Foil M.D.   On: 02/08/2021 18:31    Procedures Procedures   Medications Ordered in ED Medications  0.9 %  sodium chloride infusion ( Intravenous New Bag/Given 02/08/21 1859)  0.9 % irrigation (POUR BTL) (1,000 mLs Irrigation Given 02/08/21 1956)   cefTRIAXone (ROCEPHIN) 2 g in sodium chloride 0.9 % 100 mL IVPB (0 g Intravenous Stopped 02/08/21 2113)    And  metroNIDAZOLE (FLAGYL) IVPB 500 mg (0 mg Intravenous Stopped 02/08/21 2044)  sodium chloride 0.9 % bolus 1,000 mL (0 mLs Intravenous Stopped 02/08/21 2044)  morphine 4 MG/ML injection 4 mg (4 mg Intravenous Given 02/08/21 1941)    ED Course  I have reviewed the triage vital signs and the nursing notes.  Pertinent labs & imaging results that were available during my care of the patient were reviewed by me and considered in my medical decision making (see chart for details).    MDM Rules/Calculators/A&P                          Labs show white count of 13 chemistry remarkable urine negative.  CT abdomen pelvis consistent with retrocecal appendicitis no evidence of perforation.  Surgery consulted for evaluation.   Final Clinical Impression(s) / ED Diagnoses Final diagnoses:  Acute appendicitis with localized peritonitis, without perforation, abscess, or gangrene    Rx / DC Orders  ED Discharge Orders    None       Luna Fuse, MD 02/08/21 2120

## 2021-02-08 NOTE — ED Notes (Signed)
Handoff report given to [facility] 

## 2021-02-08 NOTE — Transfer of Care (Signed)
Immediate Anesthesia Transfer of Care Note  Patient: Dan Baker  Procedure(s) Performed: APPENDECTOMY LAPAROSCOPIC (N/A Abdomen)  Patient Location: PACU  Anesthesia Type:General  Level of Consciousness: drowsy  Airway & Oxygen Therapy: Patient Spontanous Breathing  Post-op Assessment: Report given to RN and Post -op Vital signs reviewed and stable  Post vital signs: Reviewed and stable  Last Vitals:  Vitals Value Taken Time  BP    Temp    Pulse 69 02/08/21 2351  Resp 12 02/08/21 2351  SpO2 100 % 02/08/21 2351  Vitals shown include unvalidated device data.  Last Pain:  Vitals:   02/08/21 2125  TempSrc: Oral  PainSc:          Complications: No complications documented.

## 2021-02-08 NOTE — ED Notes (Signed)
Patient arrives with [facility] from [facility], patient alert and oriented, [facility] reports Flagyl finished en route and Rocephin was started. Patient able to ambulate independently on arrival

## 2021-02-08 NOTE — H&P (Signed)
Dan Baker is an 45 y.o. male.   Chief Complaint: Abdominal pain. HPI: PT with 1d h/o abd pain that was generalized and migrated to RLQ area. Pt with some nausea.   On CT at [facility] = acute appendicitis.  Pt was transferred here for treament.  Past Medical History:  Diagnosis Date  . GERD (gastroesophageal reflux disease)    occasional; Zantac as needed  . Lipoma of flank 03/2015   left  . PONV (postoperative nausea and vomiting)   . Seasonal allergies     Past Surgical History:  Procedure Laterality Date  . LUMBAR FUSION  2007   L4-5  . MASS EXCISION Left 04/15/2015   Procedure: EXCISION OF SUBCUTANEOUS MASS,LEFT BACK 10CM;  Surgeon: [name], MD;  Location: [facility];  Service: Plastics;  Laterality: Left;  . WISDOM TOOTH EXTRACTION      History reviewed. No pertinent family history. Social History:  reports that he has never smoked. He has never used smokeless tobacco. He reports current alcohol use. He reports that he does not use drugs.  Allergies:  Allergies  Allergen Reactions  . Penicillins Rash    (Not in a hospital admission)   Results for orders placed or performed during the hospital encounter of 02/08/21 (from the past 48 hour(s))  Comprehensive metabolic panel     Status: Abnormal   Collection Time: 02/08/21  5:30 PM  Result Value Ref Range   Sodium 139 135 - 145 mmol/L   Potassium 3.7 3.5 - 5.1 mmol/L   Chloride 100 98 - 111 mmol/L   CO2 29 22 - 32 mmol/L   Glucose, Bld 95 70 - 99 mg/dL    Comment: Glucose reference range applies only to samples taken after fasting for at least 8 hours.   BUN 12 6 - 20 mg/dL   Creatinine, Ser 1.09 0.61 - 1.24 mg/dL   Calcium 9.9 8.9 - 10.3 mg/dL   Total Protein 7.5 6.5 - 8.1 g/dL   Albumin 4.8 3.5 - 5.0 g/dL   AST 13 (L) 15 - 41 U/L   ALT 14 0 - 44 U/L   Alkaline Phosphatase 68 38 - 126 U/L   Total Bilirubin 0.6 0.3 - 1.2 mg/dL   GFR, Estimated >60 >60 mL/min    Comment:  (NOTE) Calculated using the CKD-EPI Creatinine Equation (2021)    Anion gap 10 5 - 15    Comment: Performed at [facility], [place], [place] [address]  Lipase, blood     Status: None   Collection Time: 02/08/21  5:30 PM  Result Value Ref Range   Lipase 19 11 - 51 U/L    Comment: Performed at [facility], [address] [place], [place] [address]  CBC with Differential     Status: Abnormal   Collection Time: 02/08/21  5:30 PM  Result Value Ref Range   WBC 13.6 (H) 4.0 - 10.5 K/uL   RBC 5.15 4.22 - 5.81 MIL/uL   Hemoglobin 15.6 13.0 - 17.0 g/dL   HCT 45.3 39.0 - 52.0 %   MCV 88.0 80.0 - 100.0 fL   MCH 30.3 26.0 - 34.0 pg   MCHC 34.4 30.0 - 36.0 g/dL   RDW 12.2 11.5 - 15.5 %   Platelets 225 150 - 400 K/uL   nRBC 0.0 0.0 - 0.2 %   Neutrophils Relative % 70 %   Neutro Abs 9.5 (H) 1.7 - 7.7 K/uL  Lymphocytes Relative 19 %   Lymphs Abs 2.5 0.7 - 4.0 K/uL   Monocytes Relative 8 %   Monocytes Absolute 1.1 (H) 0.1 - 1.0 K/uL   Eosinophils Relative 3 %   Eosinophils Absolute 0.4 0.0 - 0.5 K/uL   Basophils Relative 0 %   Basophils Absolute 0.0 0.0 - 0.1 K/uL   Immature Granulocytes 0 %   Abs Immature Granulocytes 0.06 0.00 - 0.07 K/uL    Comment: Performed at [facility], [place], [place] [address]  Urinalysis, Routine w reflex microscopic Urine, Clean Catch     Status: None   Collection Time: 02/08/21  5:50 PM  Result Value Ref Range   Color, Urine YELLOW YELLOW   APPearance CLEAR CLEAR   Specific Gravity, Urine 1.016 1.005 - 1.030   pH 6.5 5.0 - 8.0   Glucose, UA NEGATIVE NEGATIVE mg/dL   Hgb urine dipstick NEGATIVE NEGATIVE   Bilirubin Urine NEGATIVE NEGATIVE   Ketones, ur NEGATIVE NEGATIVE mg/dL   Protein, ur NEGATIVE NEGATIVE mg/dL   Nitrite NEGATIVE NEGATIVE   Leukocytes,Ua NEGATIVE NEGATIVE    Comment: Performed at [facility], [place], [place] [address]   CT ABDOMEN PELVIS WO CONTRAST  Result Date: 02/08/2021 CLINICAL DATA:  Right lower quadrant pain EXAM: CT ABDOMEN AND PELVIS WITHOUT CONTRAST TECHNIQUE: Multidetector CT imaging of the abdomen and pelvis was performed following the standard protocol without IV contrast. COMPARISON:  None. FINDINGS: Lower chest: Lung bases demonstrate no acute consolidation or effusion. Hepatobiliary: No focal liver abnormality is seen. No gallstones, gallbladder wall thickening, or biliary dilatation. Pancreas: Unremarkable. No pancreatic ductal dilatation or surrounding inflammatory changes. Spleen: Normal in size without focal abnormality. Adrenals/Urinary Tract: Adrenal glands are normal. No hydronephrosis. Multiple punctate intrarenal stones on the right. Multiple small stones in the left kidney measuring up to 3 mm in size. Urinary bladder unremarkable Stomach/Bowel: Stomach within normal limits. No dilated small bowel. Dilated retrocecal appendix with inflammation. Appendix measures up to 16 mm. No well-formed stone, punctate hyperdense debris within the lumen, coronal series 5, image 42. no extraluminal gas collection. Vascular/Lymphatic: Small nonaneurysmal aorta.  No suspicious nodes Reproductive: Prostate is unremarkable. Other: Negative for free air or free fluid Musculoskeletal: Post fusion changes L5-S1. No acute osseous abnormality IMPRESSION: Findings consistent with acute retrocecal appendicitis. Appendix: Location: Right mid to lower quadrant, retrocecal in position Diameter: 16 mm Appendicolith: Negative Mucosal hyper-enhancement: Not assessed no contrast given Extraluminal gas: Negative Periappendiceal collection: Considerable inflammation without focal fluid collection Critical Value/emergent results were called by telephone at the time of interpretation on 02/08/2021 at 6:31 pm to provider Sherwood Gambler , who verbally acknowledged these results. Electronically Signed   By: Donavan Foil M.D.   On: 02/08/2021 18:31    Review of Systems  Constitutional: Negative for chills and fever.  HENT: Negative for ear discharge, hearing loss and sore throat.   Eyes: Negative for discharge.  Respiratory: Negative for cough and shortness of breath.   Cardiovascular: Negative for chest pain and leg swelling.  Gastrointestinal: Positive for abdominal pain. Negative for constipation, diarrhea, nausea and vomiting.  Musculoskeletal: Negative for myalgias and neck pain.  Skin: Negative for rash.  Allergic/Immunologic: Negative for environmental allergies.  Neurological: Negative for dizziness and seizures.  Hematological: Does not bruise/bleed easily.  Psychiatric/Behavioral: Negative for suicidal ideas.  All other systems reviewed and are negative.   Blood pressure (!) 136/96, pulse 75, temperature 98.8 F (37.1 C), temperature source  Oral, resp. rate 16, height 6\' 1"  (1.854 m), weight 77.1 kg, SpO2 100 %. Physical Exam Constitutional:      Appearance: He is well-developed.     Comments: Conversant No acute distress  Eyes:     General: Lids are normal. No scleral icterus.    Comments: Pupils are equal round and reactive No lid lag Moist conjunctiva  Neck:     Thyroid: No thyromegaly.     Trachea: No tracheal tenderness.     Comments: No cervical lymphadenopathy Cardiovascular:     Rate and Rhythm: Normal rate and regular rhythm.     Heart sounds: No murmur heard.   Pulmonary:     Effort: Pulmonary effort is normal.     Breath sounds: Normal breath sounds. No wheezing or rales.  Abdominal:     Tenderness: There is abdominal tenderness in the right lower quadrant.     Hernia: No hernia is present.  Skin:    General: Skin is warm.     Findings: No rash.     Nails: There is no clubbing.     Comments: Normal skin turgor  Neurological:     Mental Status: He is alert and oriented to person, place, and time.     Comments: Normal gait and station  Psychiatric:         Judgment: Judgment normal.     Comments: Appropriate affect      Assessment/Plan 44 year old male with acute appendicitis. 1.  We will proceed to the operating for laparoscopic appendectomy 2.I discussed with the patient the risks benefits of the procedure to include but not limited to: Infection, bleeding, damage to surrounding structures, possible ileus, possible postoperative infection. Patient voiced understanding and wishes to proceed.   Ralene Ok, MD 02/08/2021, 7:33 PM

## 2021-02-08 NOTE — ED Provider Notes (Signed)
[facility]ERG[facility] DEPT Provider Note   CSN: [id number] Arrival date & time: 02/08/21  1622     History Chief Complaint  Patient presents with  . Abdominal Pain    Dan Baker is a 45 y.o. male.  HPI 45 year old male presents with right lower quadrant abdominal pain.  Started yesterday with some vague generalized abdominal discomfort but then since last night has been primarily right lower quadrant.  It is painful to walk.  No vomiting though may be a little bit of nausea.  No fevers or new back pain.  No urinary or GU symptoms. Pain is rated as a 6. Took some ibuprofen this morning.   Past Medical History:  Diagnosis Date  . GERD (gastroesophageal reflux disease)    occasional; Zantac as needed  . Lipoma of flank 03/2015   left  . PONV (postoperative nausea and vomiting)   . Seasonal allergies     There are no problems to display for this patient.   Past Surgical History:  Procedure Laterality Date  . LUMBAR FUSION  2007   L4-5  . MASS EXCISION Left 04/15/2015   Procedure: EXCISION OF SUBCUTANEOUS MASS,LEFT BACK 10CM;  Surgeon: [name], MD;  Location: [facility];  Service: Plastics;  Laterality: Left;  . WISDOM TOOTH EXTRACTION         History reviewed. No pertinent family history.  Social History   Tobacco Use  . Smoking status: Never Smoker  . Smokeless tobacco: Never Used  Substance Use Topics  . Alcohol use: Yes    Comment: occasionally  . Drug use: No    Home Medications Prior to Admission medications   Medication Sig Start Date End Date Taking? Authorizing Provider  cetirizine (ZYRTEC) 10 MG tablet Take 10 mg by mouth daily.    [provider]  oxyCODONE-acetaminophen (ROXICET) 5-325 MG per tablet Take 1-2 tablets by mouth every 4 (four) hours as needed for severe pain. 04/15/15   [name], MD  ranitidine (ZANTAC) 150 MG tablet Take 150 mg by mouth 2 (two) times daily.    [provider]    Allergies    Penicillins  Review of Systems   Review of Systems  Constitutional: Negative for fever.  Gastrointestinal: Positive for abdominal pain. Negative for vomiting.  Genitourinary: Negative for dysuria and testicular pain.  Musculoskeletal: Negative for back pain.  All other systems reviewed and are negative.   Physical Exam Updated Vital Signs BP 134/86 (BP Location: Right Arm)   Pulse 78   Temp 98.8 F (37.1 C) (Oral)   Resp 16   Ht 6\' 1"  (1.854 m)   Wt 77.1 kg   BMI 22.43 kg/m   Physical Exam Vitals and nursing note reviewed.  Constitutional:      Appearance: He is well-developed. He is not ill-appearing or diaphoretic.  HENT:     Head: Normocephalic and atraumatic.     Right Ear: External ear normal.     Left Ear: External ear normal.     Nose: Nose normal.  Eyes:     General:        Right eye: No discharge.        Left eye: No discharge.  Cardiovascular:     Rate and Rhythm: Normal rate and regular rhythm.     Heart sounds: Normal heart sounds.  Pulmonary:     Effort: Pulmonary effort is normal.     Breath sounds: Normal breath sounds.  Abdominal:  Palpations: Abdomen is soft.     Tenderness: There is abdominal tenderness in the right lower quadrant. There is no right CVA tenderness or left CVA tenderness.  Musculoskeletal:     Cervical back: Neck supple.  Skin:    General: Skin is warm and dry.  Neurological:     Mental Status: He is alert.  Psychiatric:        Mood and Affect: Mood is not anxious.     ED Results / Procedures / Treatments   Labs (all labs ordered are listed, but only abnormal results are displayed) Labs Reviewed  COMPREHENSIVE METABOLIC PANEL - Abnormal; Notable for the following components:      Result Value   AST 13 (*)    All other components within normal limits  CBC WITH DIFFERENTIAL/PLATELET - Abnormal; Notable for the following components:   WBC 13.6 (*)    Neutro Abs 9.5 (*)    Monocytes  Absolute 1.1 (*)    All other components within normal limits  RESP PANEL BY RT-PCR (FLU A&B, COVID) ARPGX2  LIPASE, BLOOD  URINALYSIS, ROUTINE W REFLEX MICROSCOPIC    EKG None  Radiology CT ABDOMEN PELVIS WO CONTRAST  Result Date: 02/08/2021 CLINICAL DATA:  Right lower quadrant pain EXAM: CT ABDOMEN AND PELVIS WITHOUT CONTRAST TECHNIQUE: Multidetector CT imaging of the abdomen and pelvis was performed following the standard protocol without IV contrast. COMPARISON:  None. FINDINGS: Lower chest: Lung bases demonstrate no acute consolidation or effusion. Hepatobiliary: No focal liver abnormality is seen. No gallstones, gallbladder wall thickening, or biliary dilatation. Pancreas: Unremarkable. No pancreatic ductal dilatation or surrounding inflammatory changes. Spleen: Normal in size without focal abnormality. Adrenals/Urinary Tract: Adrenal glands are normal. No hydronephrosis. Multiple punctate intrarenal stones on the right. Multiple small stones in the left kidney measuring up to 3 mm in size. Urinary bladder unremarkable Stomach/Bowel: Stomach within normal limits. No dilated small bowel. Dilated retrocecal appendix with inflammation. Appendix measures up to 16 mm. No well-formed stone, punctate hyperdense debris within the lumen, coronal series 5, image 42. no extraluminal gas collection. Vascular/Lymphatic: Small nonaneurysmal aorta.  No suspicious nodes Reproductive: Prostate is unremarkable. Other: Negative for free air or free fluid Musculoskeletal: Post fusion changes L5-S1. No acute osseous abnormality IMPRESSION: Findings consistent with acute retrocecal appendicitis. Appendix: Location: Right mid to lower quadrant, retrocecal in position Diameter: 16 mm Appendicolith: Negative Mucosal hyper-enhancement: Not assessed no contrast given Extraluminal gas: Negative Periappendiceal collection: Considerable inflammation without focal fluid collection Critical Value/emergent results were called by  telephone at the time of interpretation on 02/08/2021 at 6:31 pm to provider Sherwood Gambler , who verbally acknowledged these results. Electronically Signed   By: Donavan Foil M.D.   On: 02/08/2021 18:31    Procedures Procedures   Medications Ordered in ED Medications  cefTRIAXone (ROCEPHIN) 2 g in sodium chloride 0.9 % 100 mL IVPB (has no administration in time range)    And  metroNIDAZOLE (FLAGYL) IVPB 500 mg (has no administration in time range)  sodium chloride 0.9 % bolus 1,000 mL (has no administration in time range)    ED Course  I have reviewed the triage vital signs and the nursing notes.  Pertinent labs & imaging results that were available during my care of the patient were reviewed by me and considered in my medical decision making (see chart for details).    MDM Rules/Calculators/A&P  Patient declines pain medicine in the emergency department.  His CT confirms appendicitis.  Thankfully no abscess/perforation.  Discussed with Dr. Rosendo Gros, who accepts to [facility] for appendectomy.  There is no available room preop right now so he will be sent to the [facility] ED and then he will go to the OR.  Discussed with Dr. Ashok Cordia.  He was given Rocephin/Flagyl. Final Clinical Impression(s) / ED Diagnoses Final diagnoses:  Acute appendicitis with localized peritonitis, without perforation, abscess, or gangrene    Rx / DC Orders ED Discharge Orders    None       Sherwood Gambler, MD 02/08/21 [phone]

## 2021-02-08 NOTE — OR Nursing (Signed)
Pt's wife at bedside in Short Stay.

## 2021-02-08 NOTE — ED Notes (Signed)
States yesterday had cramping pain generalized throughout abdominal are.  Today more sharp RLQ pain.  Denies nausea or vomiting.  States BM today normal.

## 2021-02-08 NOTE — ED Triage Notes (Signed)
Patient reports RLQ pain since last night.

## 2021-02-08 NOTE — ED Notes (Signed)
Surgery at bedside.

## 2021-02-08 NOTE — ED Notes (Signed)
Patient report called Janett Billow, RN

## 2021-02-08 NOTE — Op Note (Signed)
02/08/2021  11:35 PM  PATIENT:  Dan Baker  45 y.o. male  PRE-OPERATIVE DIAGNOSIS:  appendicitis  POST-OPERATIVE DIAGNOSIS:  Acute appendicitis, non perforated  PROCEDURE:  Procedure(s): APPENDECTOMY LAPAROSCOPIC (N/A)  SURGEON:  Surgeon(s) and Role:    Ralene Ok, MD - Primary  ANESTHESIA:   local and general  EBL:  25 mL   BLOOD ADMINISTERED:none  DRAINS: none   LOCAL MEDICATIONS USED:  BUPIVICAINE   SPECIMEN:  Source of Specimen:  appendix  DISPOSITION OF SPECIMEN:  PATHOLOGY  COUNTS:  YES  TOURNIQUET:  * No tourniquets in log *  DICTATION: .Dragon Dictation Complications: none  Counts: reported as correct x 2  Findings:  The patient had a acutely inflamed appendix  Specimen: Appendix  Indications for procedure:  The patient is a 45 year old male with a history of periumbilical pain localized in the right lower quadrant patient had a CT scan which revealed signs consistent with acute appendicitis the patient back in for laparoscopic appendectomy.  Details of the procedure:The patient was taken back to the operating room. The patient was placed in supine position with bilateral SCDs in place. The patient was prepped and draped in the usual sterile fashion.  After appropriate anitbiotics were confirmed, a time-out was confirmed and all facts were verified.    A pneumoperitoneum of 14 mmHg was obtained via a Veress needle technique in the left lower quadrant quadrant.  A 5 mm trocar and 5 mm camera then placed intra-abdominally there is no injury to any intra-abdominal organs a 10 mm infraumbilical port was placed and direct visualization as was a 5 mm port in the suprapubic area.   The appendix was identified and seen to be non-perforated.  The appendix was cleaned down to the appendiceal base. The mesoappendix was then incised and the appendiceal artery was cauterized.  The the appendiceal base was clean. Upon grasping the appendix there was a  perforation and spillage of stool.  This was suction out and irrigated.  0Endoloop was placed x2 and one distally and the appendix was transected between these 2. A retrieval bag was then placed into the abdomen and the specimen placed in the bag. The appendiceal stump was cauterized. We evacuate the fluid from the pelvis until the effluent was clear.  The appendix and retrieval  bag was then retrieved via the supraumbilical port. #1 Vicryl was used to reapproximate the fascia at the umbilical port site x2. The skin was reapproximated all port sites 3-0 Monocryl subcuticular fashion. The skin was dressed with Dermabond.  The patient had the foley removed. The patient was awakened from general anesthesia was taken to recovery room in stable condition.      PLAN OF CARE: Discharge to home after PACU  PATIENT DISPOSITION:  PACU - hemodynamically stable.   Delay start of Pharmacological VTE agent (>24hrs) due to surgical blood loss or risk of bleeding: not applicable

## 2021-02-08 NOTE — Anesthesia Preprocedure Evaluation (Addendum)
Anesthesia Evaluation  Patient identified by MRN, date of birth, ID band Patient awake    Reviewed: Allergy & Precautions, NPO status , Patient's Chart, lab work & pertinent test results  History of Anesthesia Complications (+) PONV and history of anesthetic complications  Airway Mallampati: II  TM Distance: >3 FB Neck ROM: Full    Dental  (+) Dental Advisory Given, Teeth Intact   Pulmonary neg pulmonary ROS,    Pulmonary exam normal        Cardiovascular negative cardio ROS Normal cardiovascular exam     Neuro/Psych negative neurological ROS  negative psych ROS   GI/Hepatic Neg liver ROS, GERD  Medicated and Controlled, Appendicitis    Endo/Other  negative endocrine ROS  Renal/GU negative Renal ROS     Musculoskeletal negative musculoskeletal ROS (+)   Abdominal   Peds  Hematology negative hematology ROS (+)   Anesthesia Other Findings Covid test negative   Reproductive/Obstetrics                            Anesthesia Physical Anesthesia Plan  ASA: II and emergent  Anesthesia Plan: General   Post-op Pain Management:    Induction: Intravenous, Rapid sequence and Cricoid pressure planned  PONV Risk Score and Plan: 4 or greater and Treatment may vary due to age or medical condition, Ondansetron, Scopolamine patch - Pre-op, Midazolam and Dexamethasone  Airway Management Planned: Oral ETT  Additional Equipment: None  Intra-op Plan:   Post-operative Plan: Extubation in OR  Informed Consent: I have reviewed the patients History and Physical, chart, labs and discussed the procedure including the risks, benefits and alternatives for the proposed anesthesia with the patient or authorized representative who has indicated his/her understanding and acceptance.     Dental advisory given  Plan Discussed with: CRNA and Anesthesiologist  Anesthesia Plan Comments:         Anesthesia Quick Evaluation

## 2021-02-09 ENCOUNTER — Encounter (HOSPITAL_COMMUNITY): Payer: Self-pay | Admitting: General Surgery

## 2021-02-09 MED ORDER — FENTANYL CITRATE (PF) 100 MCG/2ML IJ SOLN
INTRAMUSCULAR | Status: AC
  Filled 2021-02-09: qty 2

## 2021-02-09 MED ORDER — OXYCODONE HCL 5 MG PO TABS
ORAL_TABLET | ORAL | Status: AC
  Filled 2021-02-09: qty 1

## 2021-02-09 NOTE — Progress Notes (Signed)
Wasted 74mcg of fentanyl with Clovis Cao RN.

## 2021-02-09 NOTE — Anesthesia Postprocedure Evaluation (Signed)
Anesthesia Post Note  Patient: Dan Baker  Procedure(s) Performed: APPENDECTOMY LAPAROSCOPIC (N/A Abdomen)     Patient location during evaluation: PACU Anesthesia Type: General Level of consciousness: awake and alert Pain management: pain level controlled Vital Signs Assessment: post-procedure vital signs reviewed and stable Respiratory status: spontaneous breathing, nonlabored ventilation and respiratory function stable Cardiovascular status: stable and blood pressure returned to baseline Anesthetic complications: no   No complications documented.  Last Vitals:  Vitals:   02/09/21 0040 02/09/21 0055  BP: 120/73 107/63  Pulse: 62 69  Resp: 10 14  Temp:  (!) 36 C  SpO2: 100% 96%    Last Pain:  Vitals:   02/09/21 0025  TempSrc:   PainSc: [name]

## 2021-02-10 LAB — SURGICAL PATHOLOGY

## 2021-02-22 ENCOUNTER — Encounter (HOSPITAL_COMMUNITY): Payer: Self-pay | Admitting: General Surgery

## 2022-06-08 ENCOUNTER — Ambulatory Visit: Payer: BC Managed Care – PPO | Attending: Cardiovascular Disease | Admitting: Cardiovascular Disease

## 2022-06-08 ENCOUNTER — Encounter: Payer: Self-pay | Admitting: Cardiovascular Disease

## 2022-06-08 VITALS — BP 116/90 | HR 61 | Ht 73.0 in | Wt 167.6 lb

## 2022-06-08 DIAGNOSIS — Z01810 Encounter for preprocedural cardiovascular examination: Secondary | ICD-10-CM

## 2022-06-08 DIAGNOSIS — I208 Other forms of angina pectoris: Secondary | ICD-10-CM | POA: Diagnosis not present

## 2022-06-08 DIAGNOSIS — E782 Mixed hyperlipidemia: Secondary | ICD-10-CM

## 2022-06-08 MED ORDER — METOPROLOL TARTRATE 100 MG PO TABS: ORAL_TABLET | ORAL | 0 refills | Status: AC

## 2022-06-08 NOTE — Patient Instructions (Addendum)
Medication Instructions:  Your physician recommends that you continue on your current medications as directed. Please refer to the Current Medication list given to you today.  *If you need a refill on your cardiac medications before your next appointment, please call your pharmacy*   Lab Work: BMET today If you have labs (blood work) drawn today and your tests are completely normal, you will receive your results only by: [place] (if you have MyChart) OR A paper copy in the mail If you have any lab test that is abnormal or we need to change your treatment, we will call you to review the results.   Testing/Procedures: Coronary CT Angiogram Your physician has requested that you have cardiac CT. Cardiac computed tomography (CT) is a painless test that uses an x-ray machine to take clear, detailed pictures of your heart. For further information please visit [email or web address]. Please follow instruction sheet as given.  Follow-Up: At [facility], you and your health needs are our priority.  As part of our continuing mission to provide you with exceptional heart care, we have created designated Provider Care Teams.  These Care Teams include your primary Cardiologist (physician) and Advanced Practice Providers (APPs -  Physician Assistants and Nurse Practitioners) who all work together to provide you with the care you need, when you need it.  We recommend signing up for the patient portal called "MyChart".  Sign up information is provided on this After Visit Summary.  MyChart is used to connect with patients for Virtual Visits (Telemedicine).  Patients are able to view lab/test results, encounter notes, upcoming appointments, etc.  Non-urgent messages can be sent to your provider as well.   To learn more about what you can do with MyChart, go to [email or web address].    Your next appointment:   1 year(s)  The format for your next appointment:   In Person  Provider:    Christena Deem  Other Instructions   Your cardiac CT will be scheduled at:   [facility] [address] [place],  [address] [phone]  please arrive at the [facility] and Children's Entrance (Entrance C2) of [facility] 30 minutes prior to test start time. You can use the FREE valet parking offered at entrance C (encouraged to control the heart rate for the test)  Proceed to the [facility] Radiology Department (first floor) to check-in and test prep.  All radiology patients and guests should use entrance C2 at [facility], accessed from [facility], even though the hospital's physical address listed is [address]     Please follow these instructions carefully (unless otherwise directed):  Hold all erectile dysfunction medications at least 3 days (72 hrs) prior to test. (Ie viagra, cialis, sildenafil, tadalafil, etc) We will administer nitroglycerin during this exam.   On the Night Before the Test: Be sure to Drink plenty of water. Do not consume any caffeinated/decaffeinated beverages or chocolate 12 hours prior to your test. Do not take any antihistamines 12 hours prior to your test.  On the Day of the Test: Drink plenty of water until 1 hour prior to the test. You may take your regular medications prior to the test.  Take metoprolol (Lopressor) two hours prior to test.      After the Test: Drink plenty of water. After receiving IV contrast, you may experience a mild flushed feeling. This is normal. On occasion, you may experience a mild rash up to 24 hours after the  test. This is not dangerous. If this occurs, you can take Benadryl 25 mg and increase your fluid intake. If you experience trouble breathing, this can be serious. If it is severe call 911 IMMEDIATELY. If it is mild, please call our office. If you take any of these medications: Glipizide/Metformin, Avandament, Glucavance, please do not take 48  hours after completing test unless otherwise instructed.  We will call to schedule your test 2-4 weeks out understanding that some insurance companies will need an authorization prior to the service being performed.   For non-scheduling related questions, please contact the cardiac imaging nurse navigator should you have any questions/concerns: Marchia Bond, Cardiac Imaging Nurse Navigator Gordy Clement, Cardiac Imaging Nurse Navigator [facility] Direct Office Dial: [phone]   For scheduling needs, including cancellations and rescheduling, please call [place], [phone].   Important Information About Sugar

## 2022-06-08 NOTE — Progress Notes (Unsigned)
Cardiology Office Note:    Date:  06/13/2022   ID:  Dan Baker, DOB [birth year], MRN [id number]  PCP:  Chesley Noon, MD   [facility] Providers Cardiologist:  Sherren Mocha, MD     Referring MD: Chesley Noon, MD   Chief Complaint  Patient presents with   Shortness of Breath    History of Present Illness:    Dan Baker is a 46 y.o. male with family history of premature coronary artery disease, presenting for initial cardiac evaluation.  The patient is here alone today. He has had concerns about his family history. His brother had an MI this year at age 16 and his father had a stroke and was found to have severe carotid stenosis requiring surgery. His brother went on to have congestive heart failure after his heart attack.   The patient is physically active.  He used to exercise more often, but remains active.  His exercise tolerance is mildly decreased with mild shortness of breath with activity.  Also has a vague discomfort in the chest at times.  This can be associated with high heart rates and peak exercise.  He slows down and symptoms improved.  No lightheadedness, syncope, edema, orthopnea, or PND.  Otherwise feels well with no personal history of high blood pressure or high cholesterol.  The patient is a lifelong non-smoker.  He has otherwise been healthy with no medical problems.  He takes no medications.  Past Medical History:  Diagnosis Date   GERD (gastroesophageal reflux disease)    occasional; Zantac as needed   Lipoma of flank 03/2015   left   PONV (postoperative nausea and vomiting)    Seasonal allergies     Past Surgical History:  Procedure Laterality Date   LAPAROSCOPIC APPENDECTOMY N/A 02/08/2021   Procedure: APPENDECTOMY LAPAROSCOPIC;  Surgeon: Ralene Ok, MD;  Location: [facility];  Service: General;  Laterality: N/A;   LAPAROSCOPIC APPENDECTOMY N/A    LUMBAR FUSION  09/24/2005   L4-5   MASS EXCISION Left 04/15/2015    Procedure: EXCISION OF SUBCUTANEOUS MASS,LEFT BACK 10CM;  Surgeon: [name], MD;  Location: [facility];  Service: Plastics;  Laterality: Left;   WISDOM TOOTH EXTRACTION      Current Medications: Current Meds  Medication Sig   cetirizine (ZYRTEC) 10 MG tablet Take 10 mg by mouth as needed.   metoprolol tartrate (LOPRESSOR) 100 MG tablet Take 1 tablet by mouth two hours prior to scan     Allergies:   Penicillins   Social History   Socioeconomic History   Marital status: Married    Spouse name: Not on file   Number of children: Not on file   Years of education: Not on file   Highest education level: Not on file  Occupational History   Not on file  Tobacco Use   Smoking status: Never   Smokeless tobacco: Never  Substance and Sexual Activity   Alcohol use: Yes    Comment: occasionally   Drug use: No   Sexual activity: Not on file  Other Topics Concern   Not on file  Social History Narrative   ** Merged History Encounter **       Social Determinants of Health   Financial Resource Strain: Not on file  Food Insecurity: Not on file  Transportation Needs: Not on file  Physical Activity: Not on file  Stress: Not on file  Social Connections: Not on file     Family  History: The patient's family history includes Stroke in his father.  ROS:   Please see the history of present illness.    All other systems reviewed and are negative.  EKGs/Labs/Other Studies Reviewed:    EKG:  EKG is ordered today.  The ekg ordered today demonstrates normal sinus rhythm 61 bpm, moderate voltage criteria for LVH may be normal variant.  Recent Labs: 06/08/2022: BUN 10; Creatinine, Ser 0.83; Potassium 4.5; Sodium 141  Recent Lipid Panel No results found for: "CHOL", "TRIG", "HDL", "CHOLHDL", "VLDL", "LDLCALC", "LDLDIRECT"   Risk Assessment/Calculations:         Physical Exam:    VS:  BP (!) 116/90   Pulse 61   Ht '6\' 1"'$  (1.854 m)   Wt 167 lb 9.6 oz (76 kg)    SpO2 98%   BMI 22.11 kg/m     Wt Readings from Last 3 Encounters:  06/08/22 167 lb 9.6 oz (76 kg)  02/08/21 170 lb (77.1 kg)  04/15/15 157 lb (71.2 kg)     GEN:  Well nourished, well developed in no acute distress HEENT: Normal NECK: No JVD; No carotid bruits LYMPHATICS: No lymphadenopathy CARDIAC: RRR, no murmurs, rubs, gallops RESPIRATORY:  Clear to auscultation without rales, wheezing or rhonchi  ABDOMEN: Soft, non-tender, non-distended MUSCULOSKELETAL:  No edema; No deformity  SKIN: Warm and dry NEUROLOGIC:  Alert and oriented x 3 PSYCHIATRIC:  Normal affect   ASSESSMENT:    1. Exertional angina (HCC)   2. Mixed hyperlipidemia   3. Pre-procedural cardiovascular examination    PLAN:    In order of problems listed above:  The patient has a strong family history of premature CAD with his brother suffering a heart attack at age 64.  The patient has now developed some vague symptoms with exertion that could be an anginal equivalent.  Considering his strong family history, I think it is appropriate to evaluate him with a gated coronary CTA to rule out obstructive coronary artery disease.  Pending the results of those findings, further medical therapy will be recommended. The patient's LDL cholesterol is 108 mg/dL.  I reviewed his labs through care everywhere.  We will determine need for a statin drug or other lipid-lowering therapy based on the results of his gated coronary CTA.  If he has any atherosclerotic disease, his target LDL will be less than 70 and statin therapy will be indicated.           Medication Adjustments/Labs and Tests Ordered: Current medicines are reviewed at length with the patient today.  Concerns regarding medicines are outlined above.  Orders Placed This Encounter  Procedures   CT CORONARY MORPH W/CTA COR W/SCORE W/CA W/CM &/OR WO/CM   Basic metabolic panel   EKG [id number]   Meds ordered this encounter  Medications   metoprolol tartrate  (LOPRESSOR) 100 MG tablet    Sig: Take 1 tablet by mouth two hours prior to scan    Dispense:  1 tablet    Refill:  0    Patient Instructions  Medication Instructions:  Your physician recommends that you continue on your current medications as directed. Please refer to the Current Medication list given to you today.  *If you need a refill on your cardiac medications before your next appointment, please call your pharmacy*   Lab Work: BMET today If you have labs (blood work) drawn today and your tests are completely normal, you will receive your results only by: [place] (if you have MyChart) OR A  paper copy in the mail If you have any lab test that is abnormal or we need to change your treatment, we will call you to review the results.   Testing/Procedures: Coronary CT Angiogram Your physician has requested that you have cardiac CT. Cardiac computed tomography (CT) is a painless test that uses an x-ray machine to take clear, detailed pictures of your heart. For further information please visit [email or web address]. Please follow instruction sheet as given.  Follow-Up: At [facility], you and your health needs are our priority.  As part of our continuing mission to provide you with exceptional heart care, we have created designated Provider Care Teams.  These Care Teams include your primary Cardiologist (physician) and Advanced Practice Providers (APPs -  Physician Assistants and Nurse Practitioners) who all work together to provide you with the care you need, when you need it.  We recommend signing up for the patient portal called "MyChart".  Sign up information is provided on this After Visit Summary.  MyChart is used to connect with patients for Virtual Visits (Telemedicine).  Patients are able to view lab/test results, encounter notes, upcoming appointments, etc.  Non-urgent messages can be sent to your provider as well.   To learn more about what you can do with  MyChart, go to [email or web address].    Your next appointment:   1 year(s)  The format for your next appointment:   In Person  Provider:   Christena Deem  Other Instructions   Your cardiac CT will be scheduled at:   [facility] [address] [place], [place] [address] [phone]  please arrive at the [facility] and Children's Entrance (Entrance C2) of [facility] 30 minutes prior to test start time. You can use the FREE valet parking offered at entrance C (encouraged to control the heart rate for the test)  Proceed to the [facility] Radiology Department (first floor) to check-in and test prep.  All radiology patients and guests should use entrance C2 at [facility], accessed from [facility], even though the hospital's physical address listed is [address]     Please follow these instructions carefully (unless otherwise directed):  Hold all erectile dysfunction medications at least 3 days (72 hrs) prior to test. (Ie viagra, cialis, sildenafil, tadalafil, etc) We will administer nitroglycerin during this exam.   On the Night Before the Test: Be sure to Drink plenty of water. Do not consume any caffeinated/decaffeinated beverages or chocolate 12 hours prior to your test. Do not take any antihistamines 12 hours prior to your test.  On the Day of the Test: Drink plenty of water until 1 hour prior to the test. You may take your regular medications prior to the test.  Take metoprolol (Lopressor) two hours prior to test.      After the Test: Drink plenty of water. After receiving IV contrast, you may experience a mild flushed feeling. This is normal. On occasion, you may experience a mild rash up to 24 hours after the test. This is not dangerous. If this occurs, you can take Benadryl 25 mg and increase your fluid intake. If you experience trouble breathing, this can be serious. If it is severe call 911 IMMEDIATELY.  If it is mild, please call our office. If you take any of these medications: Glipizide/Metformin, Avandament, Glucavance, please do not take 48 hours after completing test unless otherwise instructed.  We will call to schedule your test 2-4 weeks out understanding that  some insurance companies will need an authorization prior to the service being performed.   For non-scheduling related questions, please contact the cardiac imaging nurse navigator should you have any questions/concerns: Marchia Bond, Cardiac Imaging Nurse Navigator Gordy Clement, Cardiac Imaging Nurse Navigator [place] [facility] Direct Office Dial: [phone]   For scheduling needs, including cancellations and rescheduling, please call [place], [phone].   Important Information About Sugar         Signed, Sherren Mocha, MD  06/13/2022 2:13 PM    [place]

## 2022-06-09 LAB — BASIC METABOLIC PANEL
BUN/Creatinine Ratio: 12 (ref 9–20)
BUN: 10 mg/dL (ref 6–24)
CO2: 24 mmol/L (ref 20–29)
Calcium: 10.3 mg/dL — ABNORMAL HIGH (ref 8.7–10.2)
Chloride: 102 mmol/L (ref 96–106)
Creatinine, Ser: 0.83 mg/dL (ref 0.76–1.27)
Glucose: 85 mg/dL (ref 70–99)
Potassium: 4.5 mmol/L (ref 3.5–5.2)
Sodium: 141 mmol/L (ref 134–144)
eGFR: 110 mL/min/{1.73_m2} (ref >59)

## 2022-06-13 ENCOUNTER — Encounter: Payer: Self-pay | Admitting: Cardiovascular Disease

## 2022-06-29 ENCOUNTER — Telehealth (HOSPITAL_COMMUNITY): Payer: Self-pay | Admitting: Emergency Medicine

## 2022-06-29 NOTE — Telephone Encounter (Signed)
Attempted to call patient regarding upcoming cardiac CT appointment. °Left message on voicemail with name and callback number °Fares Ramthun RN Navigator Cardiac Imaging °[facility] °[phone] Office °[phone] Cell ° °

## 2022-07-02 ENCOUNTER — Ambulatory Visit (HOSPITAL_COMMUNITY)
Admission: RE | Admit: 2022-07-02 | Discharge: 2022-07-02 | Disposition: A | Discharge status: Home / Self Care | Payer: BC Managed Care – PPO | Source: Ambulatory Visit | Attending: Cardiovascular Disease | Admitting: Cardiovascular Disease

## 2022-07-02 DIAGNOSIS — Z01810 Encounter for preprocedural cardiovascular examination: Secondary | ICD-10-CM | POA: Diagnosis present

## 2022-07-02 DIAGNOSIS — E782 Mixed hyperlipidemia: Secondary | ICD-10-CM | POA: Insufficient documentation

## 2022-07-02 DIAGNOSIS — I2089 Other forms of angina pectoris: Secondary | ICD-10-CM | POA: Insufficient documentation

## 2022-07-02 MED ORDER — IOHEXOL 350 MG/ML SOLN
95.0000 mL | Freq: Once | INTRAVENOUS | Status: AC | PRN
  Administered 2022-07-02: 95 mL via INTRAVENOUS

## 2022-07-02 MED ORDER — NITROGLYCERIN 0.4 MG SL SUBL
SUBLINGUAL_TABLET | SUBLINGUAL | Status: AC
  Filled 2022-07-02: qty 2

## 2022-07-02 MED ORDER — NITROGLYCERIN 0.4 MG SL SUBL
0.8000 mg | SUBLINGUAL_TABLET | Freq: Once | SUBLINGUAL | Status: AC
  Administered 2022-07-02: 0.8 mg via SUBLINGUAL

## 2022-11-07 IMAGING — CT CT ABD-PELV W/O CM
2 of 4 series · 16 of 46 positions shown, 18 images · non-contrast
Comparison: None.

CLINICAL DATA: Right lower quadrant pain

EXAM:
CT ABDOMEN AND PELVIS WITHOUT CONTRAST
TECHNIQUE: Multidetector CT imaging of the abdomen and pelvis was performed
following the standard protocol without IV contrast.

[Series 2: abd pel wo · axial · 0.71mm/px · z∈[+739,+1179]mm · 13 of 96 slices shown, 15 images]
[im 4/96  soft-tissue]
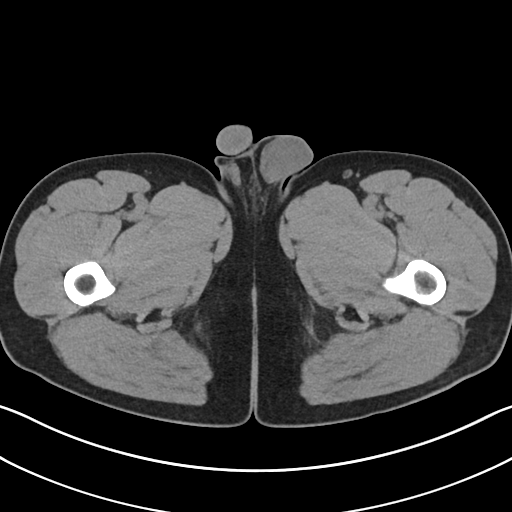
[im 4/96  bone]
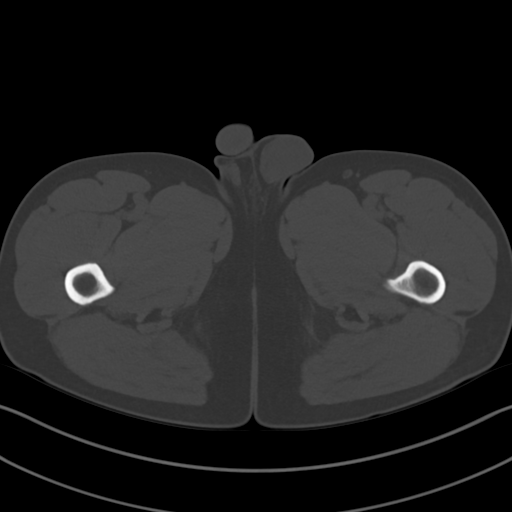
[im 11/96  soft-tissue]
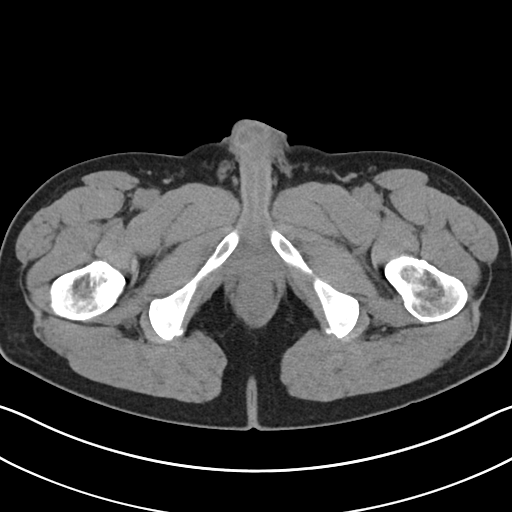
[im 19/96  soft-tissue]
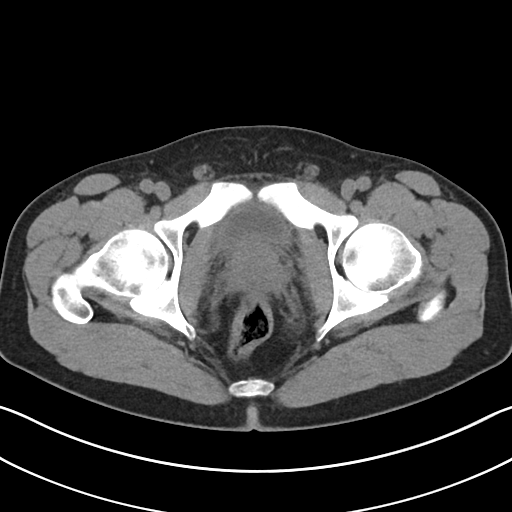
[im 26/96  soft-tissue]
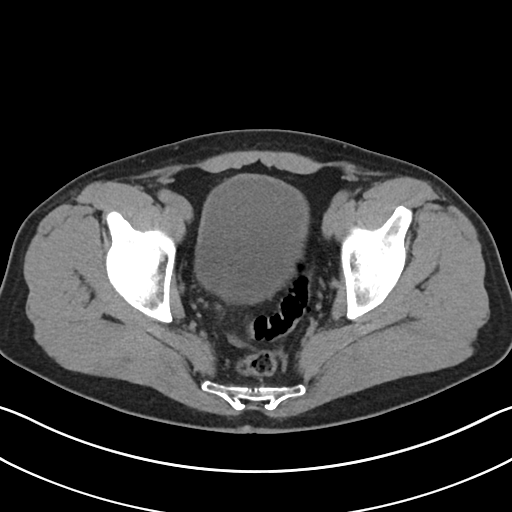
[im 33/96  soft-tissue]
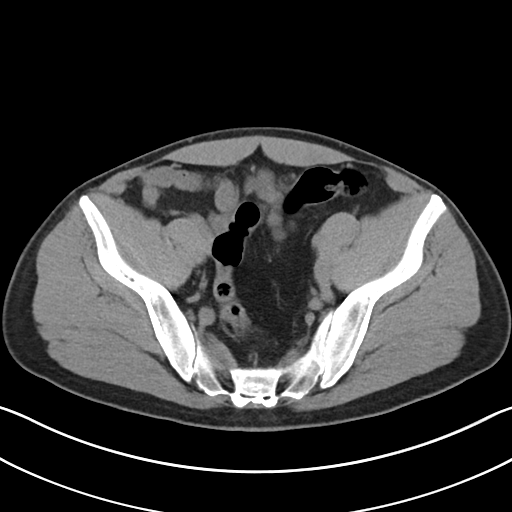
[im 41/96  soft-tissue]
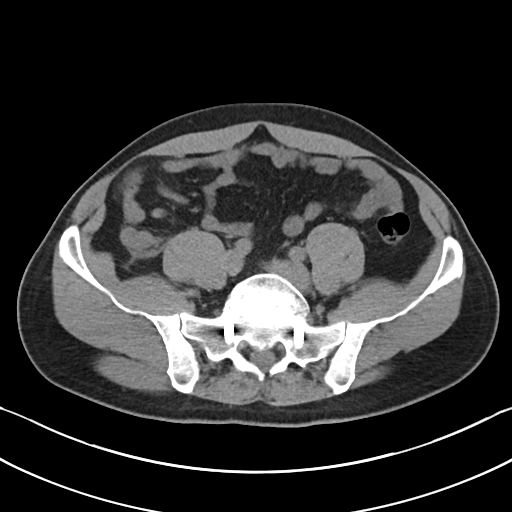
[im 48/96  soft-tissue]
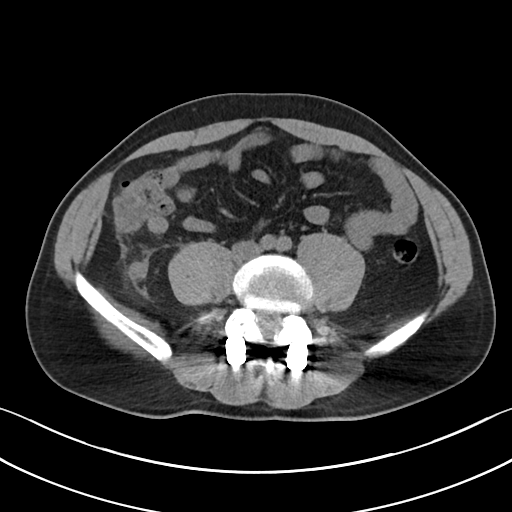
[im 55/96  soft-tissue]
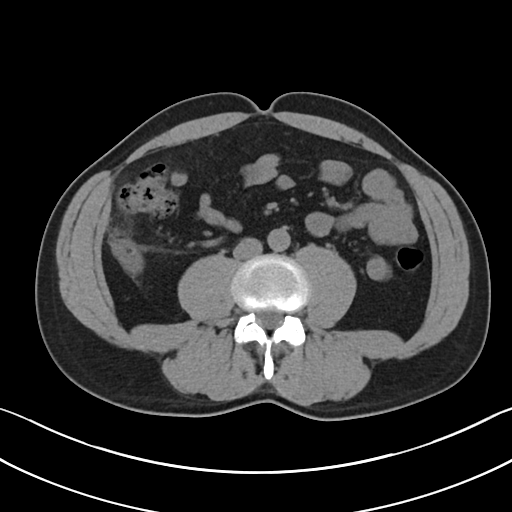
[im 63/96  soft-tissue]
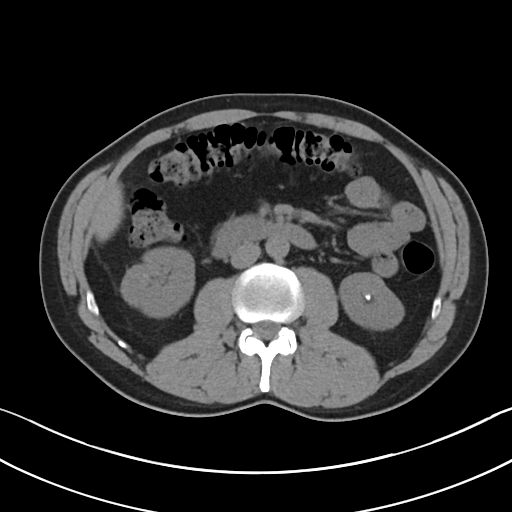
[im 63/96  bone]
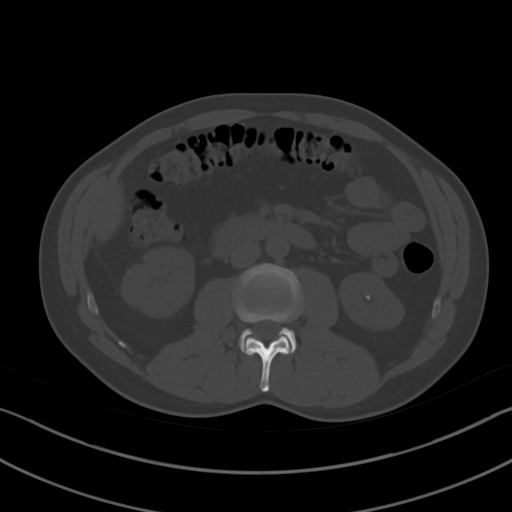
[im 70/96  soft-tissue]
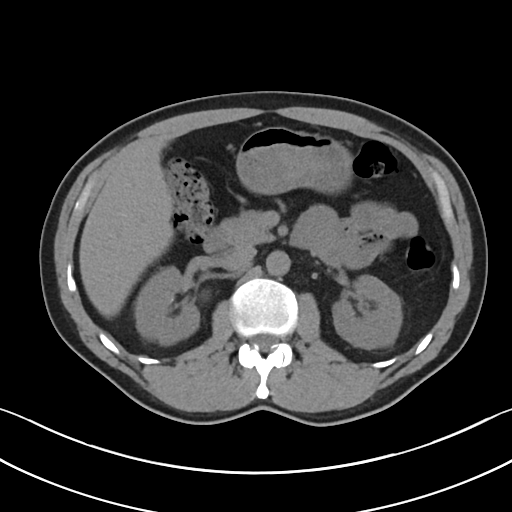
[im 77/96  soft-tissue]
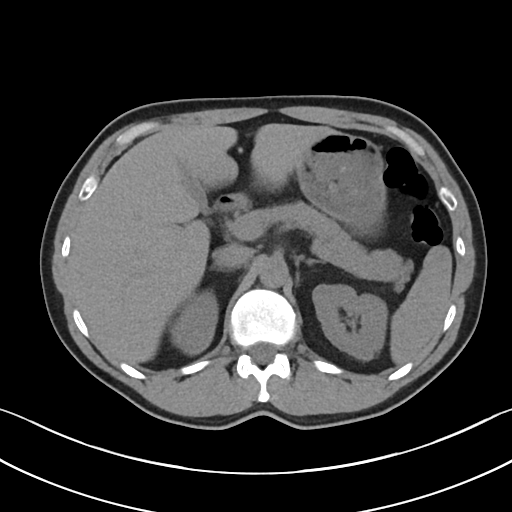
[im 85/96  soft-tissue]
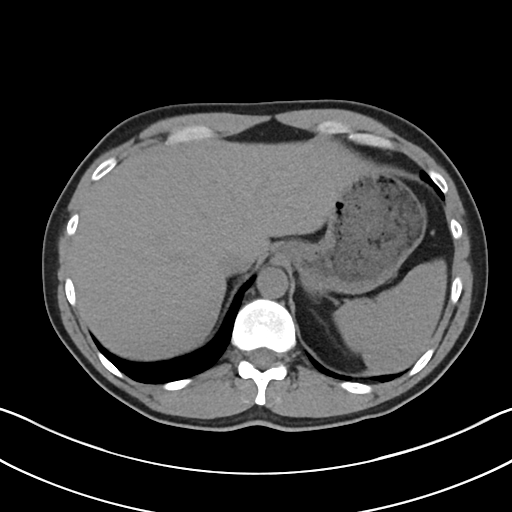
[im 92/96  soft-tissue]
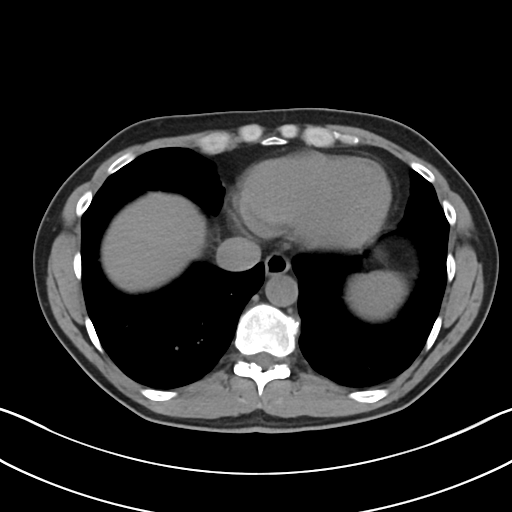

[Series 5: coronal (person_name) · coronal · 0.74mm/px · 3 of 89 slices shown]
[im 30/89  soft-tissue]
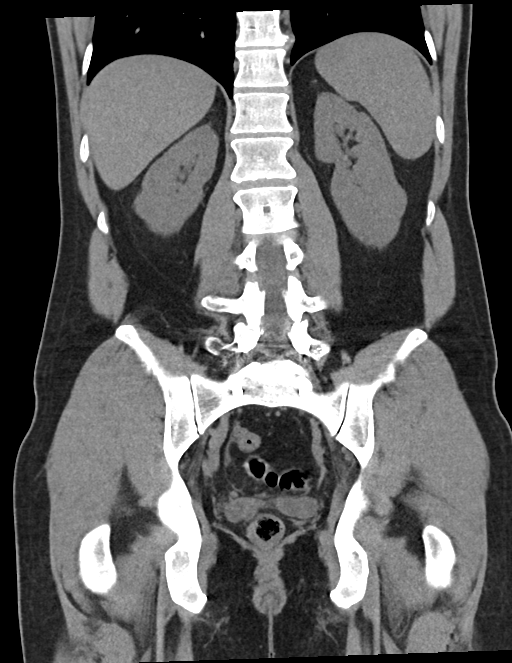
[im 40/89  soft-tissue]
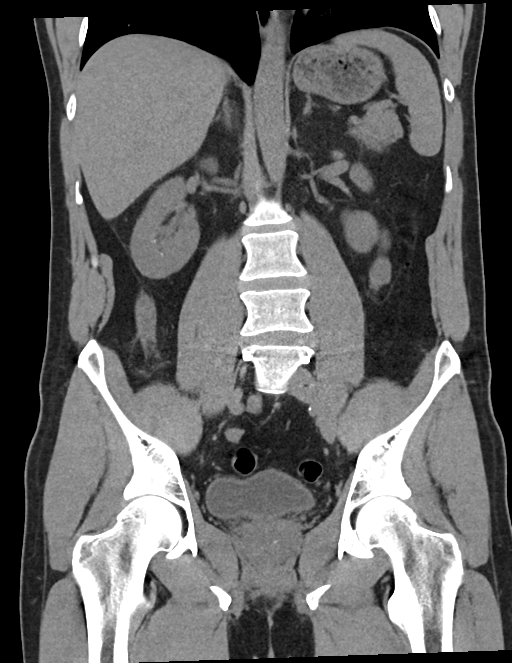
[im 49/89  soft-tissue]
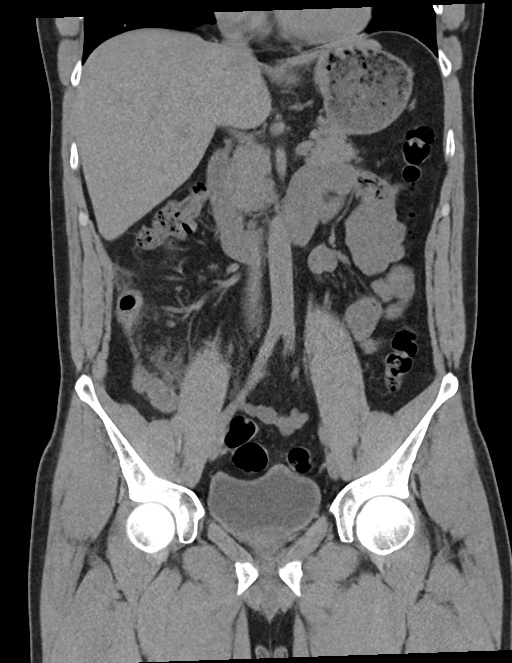

[16 of 46 positions shown; findings below may reference images not displayed]

FINDINGS: Lower chest: Lung bases demonstrate no acute consolidation or
effusion.

Hepatobiliary: No focal liver abnormality is seen. No gallstones,
gallbladder wall thickening, or biliary dilatation.

Pancreas: Unremarkable. No pancreatic ductal dilatation or
surrounding inflammatory changes.

Spleen: Normal in size without focal abnormality.

Adrenals/Urinary Tract: Adrenal glands are normal. No
hydronephrosis. Multiple punctate intrarenal stones on the right.
Multiple small stones in the left kidney measuring up to 3 mm in
size. Urinary bladder unremarkable

Stomach/Bowel: Stomach within normal limits. No dilated small bowel.
Dilated retrocecal appendix with inflammation. Appendix measures up
to 16 mm. No well-formed stone, punctate hyperdense debris within
the lumen, coronal series 5, image 42. no extraluminal gas
collection.

Vascular/Lymphatic: Small nonaneurysmal aorta.  No suspicious nodes

Reproductive: Prostate is unremarkable.

Other: Negative for free air or free fluid

Musculoskeletal: Post fusion changes L5-S1. No acute osseous
abnormality
IMPRESSION: Findings consistent with acute retrocecal appendicitis.

Appendix: Location: Right mid to lower quadrant, retrocecal in
position

Diameter: 16 mm

Appendicolith: Negative

Mucosal hyper-enhancement: Not assessed no contrast given

Extraluminal gas: Negative

Periappendiceal collection: Considerable inflammation without focal
fluid collection

Critical Value/emergent results were called by telephone at the time
of interpretation on 02/08/2021 at [DATE] to provider MARIZA DE JESUS MECK
, who verbally acknowledged these results.
